# Patient Record
Sex: Male | Born: 1965 | Race: White | Hispanic: No | Smoking: Current some day smoker
Health system: Southern US, Community
[De-identification: ages and names within clinical notes are randomized; demographics above are authoritative.]

## PROBLEM LIST (undated history)

## (undated) DIAGNOSIS — Z87442 Personal history of urinary calculi: Secondary | ICD-10-CM

## (undated) DIAGNOSIS — G5603 Carpal tunnel syndrome, bilateral upper limbs: Secondary | ICD-10-CM

## (undated) DIAGNOSIS — I10 Essential (primary) hypertension: Secondary | ICD-10-CM

## (undated) DIAGNOSIS — S069X9A Unspecified intracranial injury with loss of consciousness of unspecified duration, initial encounter: Secondary | ICD-10-CM

## (undated) DIAGNOSIS — F419 Anxiety disorder, unspecified: Secondary | ICD-10-CM

## (undated) DIAGNOSIS — I219 Acute myocardial infarction, unspecified: Secondary | ICD-10-CM

## (undated) DIAGNOSIS — J45909 Unspecified asthma, uncomplicated: Secondary | ICD-10-CM

## (undated) DIAGNOSIS — W57XXXA Bitten or stung by nonvenomous insect and other nonvenomous arthropods, initial encounter: Secondary | ICD-10-CM

## (undated) DIAGNOSIS — I2510 Atherosclerotic heart disease of native coronary artery without angina pectoris: Secondary | ICD-10-CM

## (undated) HISTORY — DX: Bitten or stung by nonvenomous insect and other nonvenomous arthropods, initial encounter: W57.XXXA

## (undated) HISTORY — DX: Anxiety disorder, unspecified: F41.9

---
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
## 1997-01-28 HISTORY — PX: WISDOM TOOTH EXTRACTION: SHX21

## 1998-12-09 ENCOUNTER — Inpatient Hospital Stay (HOSPITAL_COMMUNITY): Admission: EM | Admit: 1998-12-09 | Discharge: 1998-12-10 | Payer: Self-pay | Admitting: *Deleted

## 1998-12-10 ENCOUNTER — Encounter: Payer: Self-pay | Admitting: Internal Medicine

## 1999-08-22 ENCOUNTER — Emergency Department (HOSPITAL_COMMUNITY): Admission: EM | Admit: 1999-08-22 | Discharge: 1999-08-22 | Payer: Self-pay | Admitting: Emergency Medicine

## 2003-12-30 ENCOUNTER — Ambulatory Visit: Payer: Self-pay | Admitting: Cardiology

## 2004-01-09 ENCOUNTER — Ambulatory Visit: Payer: Self-pay

## 2004-10-10 ENCOUNTER — Emergency Department (HOSPITAL_COMMUNITY): Admission: EM | Admit: 2004-10-10 | Discharge: 2004-10-10 | Payer: Self-pay | Admitting: Emergency Medicine

## 2007-06-13 ENCOUNTER — Emergency Department (HOSPITAL_COMMUNITY): Admission: EM | Admit: 2007-06-13 | Discharge: 2007-06-13 | Payer: Self-pay | Admitting: Emergency Medicine

## 2009-08-27 ENCOUNTER — Emergency Department (HOSPITAL_COMMUNITY): Admission: EM | Admit: 2009-08-27 | Discharge: 2009-08-27 | Payer: Self-pay | Admitting: Emergency Medicine

## 2012-03-15 ENCOUNTER — Encounter (HOSPITAL_COMMUNITY): Payer: Self-pay | Admitting: Emergency Medicine

## 2012-03-15 ENCOUNTER — Emergency Department (HOSPITAL_COMMUNITY)
Admission: EM | Admit: 2012-03-15 | Discharge: 2012-03-15 | Disposition: A | Discharge status: Home / Self Care | Payer: Self-pay | Attending: Emergency Medicine | Admitting: Emergency Medicine

## 2012-03-15 ENCOUNTER — Emergency Department (HOSPITAL_COMMUNITY): Payer: Self-pay

## 2012-03-15 DIAGNOSIS — M2550 Pain in unspecified joint: Secondary | ICD-10-CM | POA: Insufficient documentation

## 2012-03-15 DIAGNOSIS — J45909 Unspecified asthma, uncomplicated: Secondary | ICD-10-CM | POA: Insufficient documentation

## 2012-03-15 DIAGNOSIS — Z8669 Personal history of other diseases of the nervous system and sense organs: Secondary | ICD-10-CM | POA: Insufficient documentation

## 2012-03-15 DIAGNOSIS — W010XXA Fall on same level from slipping, tripping and stumbling without subsequent striking against object, initial encounter: Secondary | ICD-10-CM | POA: Insufficient documentation

## 2012-03-15 DIAGNOSIS — Y9389 Activity, other specified: Secondary | ICD-10-CM | POA: Insufficient documentation

## 2012-03-15 DIAGNOSIS — W19XXXA Unspecified fall, initial encounter: Secondary | ICD-10-CM

## 2012-03-15 DIAGNOSIS — M546 Pain in thoracic spine: Secondary | ICD-10-CM

## 2012-03-15 DIAGNOSIS — Y9289 Other specified places as the place of occurrence of the external cause: Secondary | ICD-10-CM | POA: Insufficient documentation

## 2012-03-15 DIAGNOSIS — Z79899 Other long term (current) drug therapy: Secondary | ICD-10-CM | POA: Insufficient documentation

## 2012-03-15 DIAGNOSIS — F17200 Nicotine dependence, unspecified, uncomplicated: Secondary | ICD-10-CM | POA: Insufficient documentation

## 2012-03-15 HISTORY — DX: Essential (primary) hypertension: I10

## 2012-03-15 HISTORY — DX: Carpal tunnel syndrome, bilateral upper limbs: G56.03

## 2012-03-15 HISTORY — DX: Unspecified asthma, uncomplicated: J45.909

## 2012-03-15 MED ORDER — CYCLOBENZAPRINE HCL 10 MG PO TABS: 10.0000 mg | ORAL_TABLET | Freq: Two times a day (BID) | ORAL | Status: DC | PRN

## 2012-03-15 MED ORDER — HYDROCODONE-ACETAMINOPHEN 5-325 MG PO TABS: 1.0000 | ORAL_TABLET | ORAL | Status: DC | PRN

## 2012-03-15 MED ORDER — NAPROXEN 500 MG PO TABS: 500.0000 mg | ORAL_TABLET | Freq: Two times a day (BID) | ORAL | Status: DC

## 2012-03-15 NOTE — ED Notes (Signed)
Patient transported to X-ray 

## 2012-03-15 NOTE — ED Provider Notes (Signed)
History     CSN: [id number]  Arrival date & time 03/15/12  1208   First MD Initiated Contact with Patient 03/15/12 1248      Chief Complaint  Patient presents with  . Back Pain  . Shoulder Pain    (Consider location/radiation/quality/duration/timing/severity/associated sxs/prior treatment) HPI Adam [name] is a 47 y.o. male who state he slipped on ice 2 days ago, fell down onto his back. States landed onto right shoulder and back. Did not hit head. For the last two days, pain in the thoracic spine area, headache, neck pain. No pain radiating down extremities. No numbness or weakness of arms or legs. No dizziness, visual changes, n/v. Taking ibuprofen with no relief.    Past Medical History  Diagnosis Date  . Hypertension   . Asthma   . Carpal tunnel syndrome on both sides     History reviewed. No pertinent past surgical history.  Family History  Problem Relation Age of Onset  . Hypertension Father   . Cancer Father   . Heart failure Father     History  Substance Use Topics  . Smoking status: Current Every Day Smoker    Types: Cigarettes  . Smokeless tobacco: Not on file  . Alcohol Use: Yes      Review of Systems  Constitutional: Negative for fever and chills.  HENT: Positive for neck pain. Negative for neck stiffness.   Eyes: Negative for visual disturbance.  Respiratory: Negative.   Cardiovascular: Negative.   Gastrointestinal: Negative for nausea, vomiting and abdominal pain.  Genitourinary: Negative for dysuria and flank pain.  Musculoskeletal: Positive for back pain and arthralgias.  Skin: Negative.   Neurological: Positive for headaches. Negative for dizziness, weakness, light-headedness and numbness.  Hematological: Negative.     Allergies  Review of patient's allergies indicates no known allergies.  Home Medications   Current Outpatient Rx  Name  Route  Sig  Dispense  Refill  . albuterol (PROVENTIL HFA;VENTOLIN HFA) 108 (90 BASE) MCG/ACT  inhaler   Inhalation   Inhale 2 puffs into the lungs every 6 (six) hours as needed for wheezing or shortness of breath.         [name] ibuprofen (ADVIL,MOTRIN) 200 MG tablet   Oral   Take 400 mg by mouth every 6 (six) hours as needed for pain.         . Multiple Vitamin (MULTIVITAMIN WITH MINERALS) TABS   Oral   Take 1 tablet by mouth daily.         . pregabalin (LYRICA) 25 MG capsule   Oral   Take 25 mg by mouth 2 (two) times daily as needed (for pain).         . cyclobenzaprine (FLEXERIL) 10 MG tablet   Oral   Take 1 tablet (10 mg total) by mouth 2 (two) times daily as needed for muscle spasms.   20 tablet   0   . HYDROcodone-acetaminophen (NORCO/VICODIN) 5-325 MG per tablet   Oral   Take 1 tablet by mouth every 4 (four) hours as needed for pain.   6 tablet   0   . naproxen (NAPROSYN) 500 MG tablet   Oral   Take 1 tablet (500 mg total) by mouth 2 (two) times daily.   30 tablet   0     BP 139/96  Pulse 95  Temp(Src) 98.3 F (36.8 C) (Oral)  SpO2 100%  Physical Exam  Nursing note and vitals reviewed. Constitutional: He is oriented to person,  place, and time. He appears well-developed and well-nourished. No distress.  HENT:  Head: Normocephalic and atraumatic.  Right Ear: External ear normal.  Left Ear: External ear normal.  Nose: Nose normal.  Mouth/Throat: Oropharynx is clear and moist.  Eyes: Conjunctivae are normal. Pupils are equal, round, and reactive to light.  Neck: Neck supple.  Cardiovascular: Normal rate, regular rhythm and normal heart sounds.   Pulmonary/Chest: Effort normal and breath sounds normal. No respiratory distress. He has no wheezes. He has no rales.  Musculoskeletal:  Tender in cervical and thoracic midline spine. Pain with ROM of the neck. Full ROM. Full ROM of bilateral shoulder joints. Good strength with resistance with shoulder flexion, extension, ab and adduction. 5/5 and normal deltoid, bicep and tricep strength. 5/5 and equal  strength in LE. Gait normal.   Neurological: He is alert and oriented to person, place, and time. No cranial nerve deficit. Coordination normal.  Skin: Skin is warm and dry.  Psychiatric: He has a normal mood and affect. His behavior is normal.    ED Course  Procedures (including critical care time)  Labs Reviewed - No data to display Dg Cervical Spine Complete  03/15/2012  *RADIOLOGY REPORT*  Clinical Data: Back pain, shoulder pain.  CERVICAL SPINE - COMPLETE 4+ VIEW  Comparison: None.  Findings: Degenerative disc disease changes at C4-5 with disc space narrowing and early spurring.  Normal alignment.  Prevertebral soft tissues are normal.  Mild to moderate right neural foraminal narrowing from C4-5 through C6-7.  Mild neural foraminal narrowing on the right at the same levels.  No fracture.  IMPRESSION: Degenerative changes as above.  Bilateral neural foraminal narrowing from C4-5 through C6-7, right greater than left.   Original Report Authenticated By: Charlett Nose, M.D.    Dg Thoracic Spine 2 View  03/15/2012  *RADIOLOGY REPORT*  Clinical Data: Back pain.Fall.  THORACIC SPINE - 2 VIEW  Comparison: None  Findings: Early degenerative spurring in the mid and lower thoracic spine.  Normal alignment.  No fracture.  IMPRESSION: No acute bony abnormality.   Original Report Authenticated By: Charlett Nose, M.D.      1. Fall   2. Pain in thoracic spine       MDM  Pt with mechanical fall. No head injury. No signs of head trauma. Neurovascularly intact. Tender in midline and paravertebral cervical and thoracic spine. X-rays obtained and negative. Will treat with pain medications, muscle relaxant at home, follow up with PCP.        Lottie Mussel, PA 03/15/12 1635

## 2012-03-15 NOTE — ED Notes (Signed)
Pt slipped on ice 2 days ago. C/o headache, upper back pain, r/shoulder pain, neck pain. Tx with OTC meds not effective

## 2012-03-18 NOTE — ED Provider Notes (Signed)
Medical screening examination/treatment/procedure(s) were performed by non-physician practitioner and as supervising physician I was immediately available for consultation/collaboration.  Alexandra Lipps R. Brad Lieurance, MD 03/18/12 1101 

## 2012-05-25 ENCOUNTER — Telehealth: Payer: Self-pay | Admitting: Family Medicine

## 2012-05-25 MED ORDER — DOXYCYCLINE HYCLATE 100 MG PO TABS: 100.0000 mg | ORAL_TABLET | Freq: Two times a day (BID) | ORAL | Status: DC

## 2012-05-25 NOTE — Telephone Encounter (Signed)
Wife wants to know if you could call an antibiotic for tick bite.Pt has red adn swollen area for 2 days and located at the buttocks.

## 2012-05-25 NOTE — Telephone Encounter (Signed)
Pt aware of antibiotic and med c/o

## 2012-05-25 NOTE — Telephone Encounter (Signed)
Doxy 100 mg pobid for 10 days

## 2012-09-14 ENCOUNTER — Emergency Department (HOSPITAL_COMMUNITY): Payer: Medicaid Other

## 2012-09-14 ENCOUNTER — Emergency Department (HOSPITAL_COMMUNITY)
Admission: EM | Admit: 2012-09-14 | Discharge: 2012-09-14 | Disposition: A | Discharge status: Home / Self Care | Payer: Medicaid Other | Attending: Emergency Medicine | Admitting: Emergency Medicine

## 2012-09-14 ENCOUNTER — Encounter (HOSPITAL_COMMUNITY): Payer: Self-pay | Admitting: *Deleted

## 2012-09-14 DIAGNOSIS — Y9389 Activity, other specified: Secondary | ICD-10-CM | POA: Insufficient documentation

## 2012-09-14 DIAGNOSIS — Y929 Unspecified place or not applicable: Secondary | ICD-10-CM | POA: Insufficient documentation

## 2012-09-14 DIAGNOSIS — S61512A Laceration without foreign body of left wrist, initial encounter: Secondary | ICD-10-CM

## 2012-09-14 DIAGNOSIS — J45909 Unspecified asthma, uncomplicated: Secondary | ICD-10-CM | POA: Insufficient documentation

## 2012-09-14 DIAGNOSIS — Z8739 Personal history of other diseases of the musculoskeletal system and connective tissue: Secondary | ICD-10-CM | POA: Insufficient documentation

## 2012-09-14 DIAGNOSIS — Z79899 Other long term (current) drug therapy: Secondary | ICD-10-CM | POA: Insufficient documentation

## 2012-09-14 DIAGNOSIS — I10 Essential (primary) hypertension: Secondary | ICD-10-CM | POA: Insufficient documentation

## 2012-09-14 DIAGNOSIS — W260XXA Contact with knife, initial encounter: Secondary | ICD-10-CM | POA: Insufficient documentation

## 2012-09-14 DIAGNOSIS — F17200 Nicotine dependence, unspecified, uncomplicated: Secondary | ICD-10-CM | POA: Insufficient documentation

## 2012-09-14 DIAGNOSIS — S61509A Unspecified open wound of unspecified wrist, initial encounter: Secondary | ICD-10-CM | POA: Insufficient documentation

## 2012-09-14 MED ORDER — HYDROCODONE-ACETAMINOPHEN 5-325 MG PO TABS: 2.0000 | ORAL_TABLET | Freq: Four times a day (QID) | ORAL | Status: DC | PRN

## 2012-09-14 NOTE — ED Notes (Signed)
Pt cut right mid wrist with utility knife by accident.  Pt has one inch lac, bleeding controlled, and moves all digits

## 2012-09-14 NOTE — ED Notes (Signed)
Wound dressed per PA instructions. Sling applied.

## 2012-09-14 NOTE — ED Notes (Signed)
PT ambulated with baseline gait; VSS; A&Ox3; no signs of distress; respirations even and unlabored; skin warm and dry; no questions upon discharge.  

## 2012-09-14 NOTE — ED Provider Notes (Signed)
CSN: [id number]     Arrival date & time 09/14/12  4098 History     First MD Initiated Contact with Patient 09/14/12 [phone]     Chief Complaint  Patient presents with  . Extremity Laceration   (Consider location/radiation/quality/duration/timing/severity/associated sxs/prior Treatment) HPI Comments: Patient presents emergency department with chief complaint of laceration. Patient states that he was using a utility knife, when it slipped out of his hand, and cut the anterior aspect of his left wrist. He states that there is moderately painful, and is worsened when he moves his fingers and wrists. He has not tried anything to alleviate her symptoms. Bleeding is controlled. His last tetanus shot is greater than 10 years ago.  The history is provided by the patient. No language interpreter was used.    Past Medical History  Diagnosis Date  . Hypertension   . Asthma   . Carpal tunnel syndrome on both sides    History reviewed. No pertinent past surgical history. Family History  Problem Relation Age of Onset  . Hypertension Father   . Cancer Father   . Heart failure Father    History  Substance Use Topics  . Smoking status: Current Every Day Smoker    Types: Cigarettes  . Smokeless tobacco: Not on file  . Alcohol Use: Yes    Review of Systems  All other systems reviewed and are negative.    Allergies  Review of patient's allergies indicates no known allergies.  Home Medications   Current Outpatient Rx  Name  Route  Sig  Dispense  Refill  . albuterol (PROVENTIL HFA;VENTOLIN HFA) 108 (90 BASE) MCG/ACT inhaler   Inhalation   Inhale 2 puffs into the lungs every 6 (six) hours as needed for wheezing or shortness of breath.         . Multiple Vitamin (MULTIVITAMIN WITH MINERALS) TABS   Oral   Take 1 tablet by mouth daily.         . naproxen sodium (ANAPROX) 220 MG tablet   Oral   Take 220 mg by mouth 2 (two) times daily with a meal.          BP 139/100  Pulse 94   Temp(Src) 98 F (36.7 C) (Oral)  Resp 18  SpO2 97% Physical Exam  Nursing note and vitals reviewed. Constitutional: He is oriented to person, place, and time. He appears well-developed and well-nourished.  HENT:  Head: Normocephalic and atraumatic.  Eyes: Conjunctivae and EOM are normal.  Neck: Normal range of motion.  Cardiovascular: Normal rate.   Pulmonary/Chest: Effort normal.  Abdominal: He exhibits no distension.  Musculoskeletal: Normal range of motion.  Index finger flexion 4/5, remaining fingers 5/5, wrist range of motion and strength 5/5  Neurological: He is alert and oriented to person, place, and time.  Skin: Skin is dry.  1 inch laceration to the distal anterior aspect of the left wrist, bleeding is controlled, no obvious foreign bodies  Psychiatric: He has a normal mood and affect. His behavior is normal. Judgment and thought content normal.  LACERATION REPAIR Performed by: Roxy Horseman Authorized by: Roxy Horseman Consent: Verbal consent obtained. Risks and benefits: risks, benefits and alternatives were discussed Consent given by: patient Patient identity confirmed: provided demographic data Prepped and Draped in normal sterile fashion Wound explored  Laceration Location: Left wrist  Laceration Length: 3 cm   No Foreign Bodies seen or palpated  Anesthesia: local infiltration  Local anesthetic: lidocaine 2 % without epinephrine  Anesthetic total: 10  ml  Irrigation method: syringe Amount of cleaning: standard  Skin closure: 4-0 Prolene   Number of sutures: 5   Technique: Simple   Patient tolerance: Patient tolerated the procedure well with no immediate complications.   ED Course   Procedures (including critical care time)  Labs Reviewed - No data to display No results found. 1. Wrist laceration, left, initial encounter     MDM  Patient with left wrist laceration. He has mild decrease strength with index finger flexion, concerning  for partial tendon laceration. Will order plain film to evaluate for foreign body and reexamined.  Patient seen by and discussed with Dr. Rubin Payor. Some concern for partial tendon laceration. Dr. Rubin Payor advises me to consult hand surgery.  Discussed patient with Dr. Mina Marble from hand surgery, who tells me to close the laceration, and to give the patient instructions to followup in his office tomorrow. Patient tolerated the laceration repair well, and with no complications. He does still have mild decreased strength in flexion, there are no sensation deficits.  Roxy Horseman, PA-C 09/14/12 1249

## 2012-09-14 NOTE — ED Notes (Signed)
WAITING FOR HAND SX TO SEE PT.

## 2012-09-14 NOTE — ED Notes (Signed)
PA at bedside.

## 2012-09-15 ENCOUNTER — Emergency Department (HOSPITAL_COMMUNITY)
Admission: EM | Admit: 2012-09-15 | Discharge: 2012-09-15 | Disposition: A | Discharge status: Home / Self Care | Payer: Self-pay

## 2012-09-15 ENCOUNTER — Encounter (HOSPITAL_COMMUNITY): Payer: Self-pay | Admitting: Pharmacy Technician

## 2012-09-15 ENCOUNTER — Other Ambulatory Visit: Payer: Self-pay | Admitting: Orthopedic Surgery

## 2012-09-15 ENCOUNTER — Encounter (HOSPITAL_COMMUNITY): Payer: Self-pay | Admitting: Nurse Practitioner

## 2012-09-15 DIAGNOSIS — Z23 Encounter for immunization: Secondary | ICD-10-CM

## 2012-09-15 MED ORDER — TETANUS-DIPHTH-ACELL PERTUSSIS 5-2.5-18.5 LF-MCG/0.5 IM SUSP
0.5000 mL | Freq: Once | INTRAMUSCULAR | Status: AC
  Administered 2012-09-15: 0.5 mL via INTRAMUSCULAR

## 2012-09-15 MED ORDER — TETANUS-DIPHTH-ACELL PERTUSSIS 5-2.5-18.5 LF-MCG/0.5 IM SUSP
INTRAMUSCULAR | Status: AC
  Filled 2012-09-15: qty 0.5

## 2012-09-15 NOTE — ED Notes (Signed)
Pt was treated here yesterday for laceration, was supposed to get a tetanus shot but didn't. States he went to see specialist today and they wanted him to come back for the tetanus shot. No complaints

## 2012-09-15 NOTE — ED Provider Notes (Signed)
Medical screening examination/treatment/procedure(s) were performed by non-physician practitioner and as supervising physician I was immediately available for consultation/collaboration.  Flint Melter, MD 09/15/12 2226

## 2012-09-15 NOTE — ED Provider Notes (Signed)
Medical screening examination/treatment/procedure(s) were performed by non-physician practitioner and as supervising physician I was immediately available for consultation/collaboration.  Juliet Rude. Rubin Payor, MD 09/15/12 1529

## 2012-09-15 NOTE — ED Provider Notes (Signed)
  CSN: [id number]     Arrival date & time 09/15/12  1306 History     None    Chief Complaint  Patient presents with  . Immunizations   (Consider location/radiation/quality/duration/timing/severity/associated sxs/prior Treatment) HPI Comments: Patient was here yesterday for laceration repair.  Folowed up with hand surgeon today, has surgery scheduled Friday.  No complaints.  Has difficulty moving fingers (suspected tendon laceration), but denies numbness or uncontrolled pain. Did not get tetanus vx yesterday during initial visit.  Presents today only for vaccination.   The history is provided by the patient.    Past Medical History  Diagnosis Date  . Hypertension   . Asthma   . Carpal tunnel syndrome on both sides    History reviewed. No pertinent past surgical history. Family History  Problem Relation Age of Onset  . Hypertension Father   . Cancer Father   . Heart failure Father    History  Substance Use Topics  . Smoking status: Current Every Day Smoker    Types: Cigarettes  . Smokeless tobacco: Not on file  . Alcohol Use: Yes    Review of Systems  Skin: Positive for wound. Negative for color change.  Neurological: Positive for numbness.    Allergies  Review of patient's allergies indicates no known allergies.  Home Medications   Current Outpatient Rx  Name  Route  Sig  Dispense  Refill  . albuterol (PROVENTIL HFA;VENTOLIN HFA) 108 (90 BASE) MCG/ACT inhaler   Inhalation   Inhale 2 puffs into the lungs every 6 (six) hours as needed for wheezing or shortness of breath.         [name] HYDROcodone-acetaminophen (NORCO/VICODIN) 5-325 MG per tablet   Oral   Take 2 tablets by mouth every 6 (six) hours as needed for pain.   15 tablet   0   . Multiple Vitamin (MULTIVITAMIN WITH MINERALS) TABS   Oral   Take 1 tablet by mouth daily.         . naproxen sodium (ANAPROX) 220 MG tablet   Oral   Take 220 mg by mouth 2 (two) times daily with a meal.          BP  133/87  Pulse 86  Temp(Src) 98.5 F (36.9 C) (Oral)  Resp 16  SpO2 98% Physical Exam  Nursing note and vitals reviewed. Constitutional: He appears well-developed and well-nourished. No distress.  HENT:  Head: Normocephalic and atraumatic.  Neck: Neck supple.  Pulmonary/Chest: Effort normal.  Musculoskeletal:  Left wrist in splint.  Left hand with sensation intact, capillary refill < 2 seconds.  Limited flexion of fingers.    Neurological: He is alert.  Skin: He is not diaphoretic.    ED Course   Procedures (including critical care time)  Labs Reviewed - No data to display Dg Wrist Complete Left  09/14/2012   *RADIOLOGY REPORT*  Clinical Data: Laceration left wrist.  LEFT WRIST - COMPLETE 3+ VIEW  Comparison: None.  Findings: Imaged bones, joints and soft tissues appear normal.  No radiopaque foreign body is identified.  IMPRESSION: Negative exam.   Original Report Authenticated By: Holley Dexter, M.D.   1. Tetanus toxoid vaccination administered at current visit     MDM  Left wrist laceration repair yesterday, no tetanus vx given.  Pt was seen by hand surgeon this morning and has scheduled surgery for Friday.  Tetanus given.  No further treatment needed in ED.    Trixie Dredge, PA-C 09/15/12 1733

## 2012-09-17 ENCOUNTER — Encounter (HOSPITAL_COMMUNITY): Payer: Self-pay

## 2012-09-17 ENCOUNTER — Encounter (HOSPITAL_COMMUNITY)
Admission: RE | Admit: 2012-09-17 | Discharge: 2012-09-17 | Disposition: A | Discharge status: Home / Self Care | Payer: Medicaid Other | Source: Ambulatory Visit | Attending: Orthopedic Surgery | Admitting: Orthopedic Surgery

## 2012-09-17 DIAGNOSIS — Z01812 Encounter for preprocedural laboratory examination: Secondary | ICD-10-CM | POA: Insufficient documentation

## 2012-09-17 HISTORY — DX: Personal history of urinary calculi: Z87.442

## 2012-09-17 HISTORY — DX: Unspecified intracranial injury with loss of consciousness of unspecified duration, initial encounter: S06.9X9A

## 2012-09-17 LAB — CBC
HCT: 40.5 % (ref 39.0–52.0)
Hemoglobin: 14.7 g/dL (ref 13.0–17.0)
RDW: 13.6 % (ref 11.5–15.5)
WBC: 11.6 10*3/uL — ABNORMAL HIGH (ref 4.0–10.5)

## 2012-09-17 MED ORDER — CEFAZOLIN SODIUM-DEXTROSE 2-3 GM-% IV SOLR
2.0000 g | INTRAVENOUS | Status: AC
  Administered 2012-09-18: 2 g via INTRAVENOUS
  Filled 2012-09-17: qty 50

## 2012-09-17 NOTE — Pre-Procedure Instructions (Signed)
Adam [name]  09/17/2012   Your procedure is scheduled on: Friday, August 22nd.  Report to [facility] at 5:30 AM.  Call this number if you have problems the morning of surgery: [phone]   Remember:   Do not eat food or drink liquids after midnight.   Take these medicines the morning of surgery with A SIP OF WATER: if needed take: Hydrocodone- Acetaminophen (Vicodin).              Use inhaler and bring to the hospital with you.   Do not wear jewelry, make-up or nail polish.  Do not wear lotions, powders, or perfumes. You may wear deodorant.   Men may shave face and neck.  Do not bring valuables to the hospital.  [facility] is not responsible for any belongings or valuables.  Contacts, dentures or bridgework may not be worn into surgery.  Leave suitcase in the car. After surgery it may be brought to your room.  For patients admitted to the hospital, checkout time is 11:00 AM the day of discharge.   Patients discharged the day of surgery will not be allowed to drivehome.  Name and phone number of your driver: -   Special Instructions:  Shower with CHG wash (Bactoshield) tonight and again in the am prior to arriving to hospital.   Please read over the following fact sheets that you were given: Pain Booklet, Coughing and Deep Breathing and Surgical Site Infection Prevention

## 2012-09-18 ENCOUNTER — Ambulatory Visit (HOSPITAL_COMMUNITY)
Admission: RE | Admit: 2012-09-18 | Discharge: 2012-09-18 | Disposition: A | Discharge status: Home / Self Care | Payer: Medicaid Other | Source: Ambulatory Visit | Attending: Orthopedic Surgery | Admitting: Orthopedic Surgery

## 2012-09-18 ENCOUNTER — Encounter (HOSPITAL_COMMUNITY): Payer: Self-pay | Admitting: Vascular Surgery

## 2012-09-18 ENCOUNTER — Encounter (HOSPITAL_COMMUNITY): Payer: Self-pay | Admitting: *Deleted

## 2012-09-18 ENCOUNTER — Ambulatory Visit (HOSPITAL_COMMUNITY): Payer: Medicaid Other | Admitting: Vascular Surgery

## 2012-09-18 ENCOUNTER — Encounter (HOSPITAL_COMMUNITY)
Admission: RE | Disposition: A | Discharge status: Home / Self Care | Payer: Self-pay | Source: Ambulatory Visit | Attending: Orthopedic Surgery

## 2012-09-18 DIAGNOSIS — S51809A Unspecified open wound of unspecified forearm, initial encounter: Secondary | ICD-10-CM | POA: Insufficient documentation

## 2012-09-18 DIAGNOSIS — J45909 Unspecified asthma, uncomplicated: Secondary | ICD-10-CM | POA: Insufficient documentation

## 2012-09-18 DIAGNOSIS — X58XXXA Exposure to other specified factors, initial encounter: Secondary | ICD-10-CM | POA: Insufficient documentation

## 2012-09-18 DIAGNOSIS — I10 Essential (primary) hypertension: Secondary | ICD-10-CM | POA: Insufficient documentation

## 2012-09-18 DIAGNOSIS — F17200 Nicotine dependence, unspecified, uncomplicated: Secondary | ICD-10-CM | POA: Insufficient documentation

## 2012-09-18 DIAGNOSIS — Y929 Unspecified place or not applicable: Secondary | ICD-10-CM | POA: Insufficient documentation

## 2012-09-18 DIAGNOSIS — G5600 Carpal tunnel syndrome, unspecified upper limb: Secondary | ICD-10-CM | POA: Insufficient documentation

## 2012-09-18 DIAGNOSIS — Z79899 Other long term (current) drug therapy: Secondary | ICD-10-CM | POA: Insufficient documentation

## 2012-09-18 DIAGNOSIS — S51802A Unspecified open wound of left forearm, initial encounter: Secondary | ICD-10-CM

## 2012-09-18 HISTORY — PX: WOUND EXPLORATION: SHX6188

## 2012-09-18 SURGERY — WOUND EXPLORATION
Anesthesia: General | Site: Wrist | Laterality: Left | Wound class: Clean

## 2012-09-18 MED ORDER — OXYCODONE HCL 5 MG PO TABS: 5.0000 mg | ORAL_TABLET | Freq: Once | ORAL | Status: DC | PRN

## 2012-09-18 MED ORDER — FENTANYL CITRATE 0.05 MG/ML IJ SOLN
INTRAMUSCULAR | Status: DC | PRN
  Administered 2012-09-18: 50 ug via INTRAVENOUS
  Administered 2012-09-18: 25 ug via INTRAVENOUS

## 2012-09-18 MED ORDER — MIDAZOLAM HCL 5 MG/5ML IJ SOLN
INTRAMUSCULAR | Status: DC | PRN
  Administered 2012-09-18: 2 mg via INTRAVENOUS

## 2012-09-18 MED ORDER — CHLORHEXIDINE GLUCONATE 4 % EX LIQD: 60.0000 mL | Freq: Once | CUTANEOUS | Status: DC

## 2012-09-18 MED ORDER — MEPERIDINE HCL 25 MG/ML IJ SOLN: 6.2500 mg | INTRAMUSCULAR | Status: DC | PRN

## 2012-09-18 MED ORDER — PROMETHAZINE HCL 25 MG/ML IJ SOLN: 6.2500 mg | INTRAMUSCULAR | Status: DC | PRN

## 2012-09-18 MED ORDER — HYDROMORPHONE HCL PF 1 MG/ML IJ SOLN
INTRAMUSCULAR | Status: AC
  Filled 2012-09-18: qty 1

## 2012-09-18 MED ORDER — OXYCODONE HCL 5 MG/5ML PO SOLN: 5.0000 mg | Freq: Once | ORAL | Status: DC | PRN

## 2012-09-18 MED ORDER — SODIUM CHLORIDE 0.9 % IR SOLN
Status: DC | PRN
  Administered 2012-09-18: 1000 mL

## 2012-09-18 MED ORDER — PROPOFOL 10 MG/ML IV BOLUS
INTRAVENOUS | Status: DC | PRN
  Administered 2012-09-18: 200 mg via INTRAVENOUS

## 2012-09-18 MED ORDER — MIDAZOLAM HCL 2 MG/2ML IJ SOLN: 0.5000 mg | Freq: Once | INTRAMUSCULAR | Status: DC | PRN

## 2012-09-18 MED ORDER — BUPIVACAINE HCL (PF) 0.25 % IJ SOLN
INTRAMUSCULAR | Status: AC
  Filled 2012-09-18: qty 30

## 2012-09-18 MED ORDER — LACTATED RINGERS IV SOLN: INTRAVENOUS | Status: DC

## 2012-09-18 MED ORDER — LIDOCAINE HCL (CARDIAC) 20 MG/ML IV SOLN
INTRAVENOUS | Status: DC | PRN
  Administered 2012-09-18: 60 mg via INTRAVENOUS

## 2012-09-18 MED ORDER — LACTATED RINGERS IV SOLN
INTRAVENOUS | Status: DC | PRN
  Administered 2012-09-18 (×2): via INTRAVENOUS

## 2012-09-18 MED ORDER — ONDANSETRON HCL 4 MG/2ML IJ SOLN
INTRAMUSCULAR | Status: DC | PRN
  Administered 2012-09-18: 4 mg via INTRAVENOUS

## 2012-09-18 MED ORDER — OXYCODONE-ACETAMINOPHEN 5-325 MG PO TABS: 1.0000 | ORAL_TABLET | ORAL | Status: DC | PRN

## 2012-09-18 MED ORDER — BUPIVACAINE HCL 0.25 % IJ SOLN
INTRAMUSCULAR | Status: DC | PRN
  Administered 2012-09-18: 10 mL

## 2012-09-18 MED ORDER — HYDROMORPHONE HCL PF 1 MG/ML IJ SOLN
0.2500 mg | INTRAMUSCULAR | Status: DC | PRN
  Administered 2012-09-18 (×4): 0.5 mg via INTRAVENOUS

## 2012-09-18 SURGICAL SUPPLY — 55 items
BANDAGE ELASTIC 4 VELCRO ST LF (GAUZE/BANDAGES/DRESSINGS) ×2 IMPLANT
BANDAGE GAUZE ELAST BULKY 4 IN (GAUZE/BANDAGES/DRESSINGS) ×2 IMPLANT
BNDG CMPR 9X4 STRL LF SNTH (GAUZE/BANDAGES/DRESSINGS) ×1
BNDG ESMARK 4X9 LF (GAUZE/BANDAGES/DRESSINGS) ×2 IMPLANT
CLOTH BEACON ORANGE TIMEOUT ST (SAFETY) ×2 IMPLANT
CORDS BIPOLAR (ELECTRODE) ×2 IMPLANT
COVER SURGICAL LIGHT HANDLE (MISCELLANEOUS) ×2 IMPLANT
CUFF TOURNIQUET SINGLE 18IN (TOURNIQUET CUFF) ×2 IMPLANT
CUFF TOURNIQUET SINGLE 24IN (TOURNIQUET CUFF) IMPLANT
DECANTER SPIKE VIAL GLASS SM (MISCELLANEOUS) IMPLANT
DRAPE OEC MINIVIEW 54X84 (DRAPES) IMPLANT
DRAPE SURG 17X23 STRL (DRAPES) ×2 IMPLANT
DURAPREP 26ML APPLICATOR (WOUND CARE) ×2 IMPLANT
GAUZE XEROFORM 1X8 LF (GAUZE/BANDAGES/DRESSINGS) ×2 IMPLANT
GLOVE BIO SURGEON STRL SZ8.5 (GLOVE) ×2 IMPLANT
GLOVE BIOGEL PI IND STRL 8 (GLOVE) ×1 IMPLANT
GLOVE BIOGEL PI INDICATOR 8 (GLOVE) ×1
GLOVE BIOGEL PI ORTHO PRO 7.5 (GLOVE) ×1
GLOVE PI ORTHO PRO STRL 7.5 (GLOVE) ×1 IMPLANT
GLOVE SURG SS PI 7.5 STRL IVOR (GLOVE) ×2 IMPLANT
GOWN BRE IMP SLV AUR XL STRL (GOWN DISPOSABLE) ×2 IMPLANT
GOWN PREVENTION PLUS XLARGE (GOWN DISPOSABLE) ×2 IMPLANT
GOWN SRG XL XLNG 56XLVL 4 (GOWN DISPOSABLE) ×1 IMPLANT
GOWN STRL NON-REIN LRG LVL3 (GOWN DISPOSABLE) IMPLANT
GOWN STRL NON-REIN XL XLG LVL4 (GOWN DISPOSABLE) ×2
KIT BASIN OR (CUSTOM PROCEDURE TRAY) ×2 IMPLANT
KIT ROOM TURNOVER OR (KITS) ×2 IMPLANT
LOOP VESSEL MAXI BLUE (MISCELLANEOUS) IMPLANT
MANIFOLD NEPTUNE II (INSTRUMENTS) IMPLANT
NEEDLE HYPO 25GX1X1/2 BEV (NEEDLE) ×2 IMPLANT
NS IRRIG 1000ML POUR BTL (IV SOLUTION) ×2 IMPLANT
PACK ORTHO EXTREMITY (CUSTOM PROCEDURE TRAY) ×2 IMPLANT
PAD ARMBOARD 7.5X6 YLW CONV (MISCELLANEOUS) ×4 IMPLANT
PAD CAST 4YDX4 CTTN HI CHSV (CAST SUPPLIES) ×1 IMPLANT
PADDING CAST COTTON 4X4 STRL (CAST SUPPLIES) ×2
SPEAR EYE SURG WECK-CEL (MISCELLANEOUS) IMPLANT
SPECIMEN JAR SMALL (MISCELLANEOUS) IMPLANT
SPONGE GAUZE 4X4 12PLY (GAUZE/BANDAGES/DRESSINGS) ×2 IMPLANT
STRIP CLOSURE SKIN 1/2X4 (GAUZE/BANDAGES/DRESSINGS) IMPLANT
SUCTION FRAZIER TIP 10 FR DISP (SUCTIONS) IMPLANT
SUT ETHIBOND 3-0 V-5 (SUTURE) IMPLANT
SUT ETHILON 5 0 PS 2 18 (SUTURE) IMPLANT
SUT MERSILENE 4 0 P 3 (SUTURE) IMPLANT
SUT PROLENE 3 0 PS 2 (SUTURE) IMPLANT
SUT PROLENE 6 0 P 1 18 (SUTURE) ×2 IMPLANT
SUT PROLENE 6 0 PC 1 (SUTURE) ×2 IMPLANT
SUT SILK 4 0 PS 2 (SUTURE) IMPLANT
SUT VIC AB 3-0 FS2 27 (SUTURE) IMPLANT
SUT VICRYL RAPIDE 4/0 PS 2 (SUTURE) ×2 IMPLANT
SYR CONTROL 10ML LL (SYRINGE) ×2 IMPLANT
TOWEL OR 17X24 6PK STRL BLUE (TOWEL DISPOSABLE) ×2 IMPLANT
TOWEL OR 17X26 10 PK STRL BLUE (TOWEL DISPOSABLE) ×2 IMPLANT
TUBE CONNECTING 12X1/4 (SUCTIONS) IMPLANT
UNDERPAD 30X30 INCONTINENT (UNDERPADS AND DIAPERS) ×2 IMPLANT
WATER STERILE IRR 1000ML POUR (IV SOLUTION) IMPLANT

## 2012-09-18 NOTE — Anesthesia Postprocedure Evaluation (Signed)
  Anesthesia Post-op Note  Patient: Adam [name]  Procedure(s) Performed: Procedure(s): EXPLORE AND REPAIR AS NEEDED LEFT WRIST (Left)  Patient Location: PACU  Anesthesia Type:General  Level of Consciousness: awake, alert , oriented and patient cooperative  Airway and Oxygen Therapy: Patient Spontanous Breathing  Post-op Pain: none  Post-op Assessment: Post-op Vital signs reviewed, Patient's Cardiovascular Status Stable, Respiratory Function Stable, Patent Airway, No signs of Nausea or vomiting and Pain level controlled  Post-op Vital Signs: Reviewed and stable  Complications: No apparent anesthesia complications

## 2012-09-18 NOTE — Anesthesia Preprocedure Evaluation (Addendum)
Anesthesia Evaluation  Patient identified by MRN, date of birth, ID band Patient awake    Reviewed: Allergy & Precautions, H&P , NPO status , Patient's Chart, lab work & pertinent test results  History of Anesthesia Complications Negative for: history of anesthetic complications  Airway Mallampati: II TM Distance: >3 FB Neck ROM: Full    Dental  (+) Chipped, Missing and Dental Advisory Given   Pulmonary asthma (no recent inhaler use) , Current Smoker,  breath sounds clear to auscultation  Pulmonary exam normal       Cardiovascular negative cardio ROS  Rhythm:Regular Rate:Normal     Neuro/Psych negative neurological ROS     GI/Hepatic negative GI ROS, Neg liver ROS,   Endo/Other  negative endocrine ROS  Renal/GU negative Renal ROS     Musculoskeletal   Abdominal   Peds  Hematology   Anesthesia Other Findings   Reproductive/Obstetrics                          Anesthesia Physical Anesthesia Plan  ASA: II  Anesthesia Plan: General   Post-op Pain Management:    Induction: Intravenous  Airway Management Planned: LMA  Additional Equipment:   Intra-op Plan:   Post-operative Plan:   Informed Consent: I have reviewed the patients History and Physical, chart, labs and discussed the procedure including the risks, benefits and alternatives for the proposed anesthesia with the patient or authorized representative who has indicated his/her understanding and acceptance.   Dental advisory given  Plan Discussed with: CRNA and Surgeon  Anesthesia Plan Comments: (Plan routine monitors, GA- LMA OK)        Anesthesia Quick Evaluation

## 2012-09-18 NOTE — Transfer of Care (Signed)
Immediate Anesthesia Transfer of Care Note  Patient: Adam [name]  Procedure(s) Performed: Procedure(s): EXPLORE AND REPAIR AS NEEDED LEFT WRIST (Left)  Patient Location: PACU  Anesthesia Type:General  Level of Consciousness: awake, alert , oriented and patient cooperative  Airway & Oxygen Therapy: Patient Spontanous Breathing and Patient connected to nasal cannula oxygen  Post-op Assessment: Report given to PACU RN, Post -op Vital signs reviewed and stable and Patient moving all extremities  Post vital signs: Reviewed and stable  Complications: No apparent anesthesia complications

## 2012-09-18 NOTE — Op Note (Signed)
See note [id number]

## 2012-09-18 NOTE — Anesthesia Procedure Notes (Signed)
Procedure Name: LMA Insertion Date/Time: 09/18/2012 7:30 AM Performed by: Jerilee Hoh Pre-anesthesia Checklist: Patient identified, Emergency Drugs available, Suction available and Patient being monitored Patient Re-evaluated:Patient Re-evaluated prior to inductionOxygen Delivery Method: Circle system utilized Preoxygenation: Pre-oxygenation with 100% oxygen Intubation Type: IV induction LMA: LMA inserted LMA Size: 4.0 Number of attempts: 1 Placement Confirmation: positive ETCO2 and breath sounds checked- equal and bilateral Tube secured with: Tape Dental Injury: Teeth and Oropharynx as per pre-operative assessment

## 2012-09-18 NOTE — Preoperative (Signed)
Beta Blockers   Reason not to administer Beta Blockers:Not Applicable 

## 2012-09-18 NOTE — H&P (Signed)
Adam [name] is an 47 y.o. male.   Chief Complaint: left distal forearm volar laceration HPI: as above with weakness in flexion  Past Medical History  Diagnosis Date  . Asthma   . Carpal tunnel syndrome on both sides   . Hypertension     no- was up a couple times whene he went to Dr , told he doesnt have HTN  . Head injury, acute, with loss of consciousness     hit on head with bucket at work- bucket fell.  [name] History of kidney stones     Past Surgical History  Procedure Laterality Date  . Wisdom tooth extraction  1999    Family History  Problem Relation Age of Onset  . Hypertension Father   . Cancer Father   . Heart failure Father    Social History:  reports that he has been smoking Cigarettes.  He has a 25 pack-year smoking history. He does not have any smokeless tobacco history on file. He reports that  drinks alcohol. He reports that he does not use illicit drugs.  Allergies: No Known Allergies  Medications Prior to Admission  Medication Sig Dispense Refill  . HYDROcodone-acetaminophen (NORCO/VICODIN) 5-325 MG per tablet Take 2 tablets by mouth every 6 (six) hours as needed for pain.  15 tablet  0  . Multiple Vitamin (MULTIVITAMIN WITH MINERALS) TABS Take 1 tablet by mouth daily.      [name] albuterol (PROVENTIL HFA;VENTOLIN HFA) 108 (90 BASE) MCG/ACT inhaler Inhale 2 puffs into the lungs every 6 (six) hours as needed for wheezing or shortness of breath.      . naproxen sodium (ANAPROX) 220 MG tablet Take 220 mg by mouth 2 (two) times daily as needed (pain).         Results for orders placed during the hospital encounter of 09/17/12 (from the past 48 hour(s))  CBC     Status: Abnormal   Collection Time    09/17/12  3:29 PM      Result Value Range   WBC 11.6 (*) 4.0 - 10.5 K/uL   RBC 4.79  4.22 - 5.81 MIL/uL   Hemoglobin 14.7  13.0 - 17.0 g/dL   HCT 64.4  03.4 - 74.2 %   MCV 84.6  78.0 - 100.0 fL   MCH 30.7  26.0 - 34.0 pg   MCHC 36.3 (*) 30.0 - 36.0 g/dL   RDW 59.5   63.8 - 75.6 %   Platelets 391  150 - 400 K/uL   No results found.  Review of Systems  All other systems reviewed and are negative.    Blood pressure 141/93, pulse 72, temperature 98.1 F (36.7 C), temperature source Oral, resp. rate 18, SpO2 98.00%. Physical Exam  Constitutional: He is oriented to person, place, and time. He appears well-developed and well-nourished.  HENT:  Head: Normocephalic and atraumatic.  Cardiovascular: Normal rate.   Respiratory: Effort normal.  Musculoskeletal:       Left forearm: He exhibits laceration.  Volar laceration with weakness in flexion  Neurological: He is alert and oriented to person, place, and time.  Skin: Skin is warm.  Psychiatric: He has a normal mood and affect. His behavior is normal. Judgment and thought content normal.     Assessment/Plan As above   Plan explore and repair as needed  Anaise Sterbenz A 09/18/2012, 7:20 AM

## 2012-09-18 NOTE — Op Note (Signed)
NAMEZACKERIAH, Adam [name] NO.:  [id number]  MEDICAL RECORD NO.:  [id number]  LOCATION:  [facility]                         FACILITY:  [facility]  PHYSICIAN:  Artist Pais. Maribelle Hopple, M.D.DATE OF BIRTH:  Sep 26, 1965  DATE OF PROCEDURE:  09/18/2012 DATE OF DISCHARGE:                              OPERATIVE REPORT   PREOPERATIVE DIAGNOSIS:  Deep laceration, volar aspect, left wrist.  POSTOPERATIVE DIAGNOSIS:  Deep laceration, volar aspect, left wrist.  PROCEDURE:  Exploration repair of flexor digitorum superficialis to the long and index fingers, left side.  SURGEON:  Artist Pais. Mina Marble, MD  ASSISTANT:  None.  ANESTHESIA:  General.  COMPLICATIONS:  No complications.  DRAINS:  No drains.  DESCRIPTION OF PROCEDURE:  The patient was taken to the operating suite. After induction of general anesthesia, left upper extremity was prepped and draped in sterile fashion.  An Esmarch was used to exsanguinate the limb.  Tourniquet was inflated to 250 mmHg.  At this point in time, an oblique laceration over the distal forearm area 4 cm proximal to the wrist crease was extended proximally and distally in oblique fashion. Dissection was carried down.  The FCR tendon was identified.  It was intact.  The median nerve and palmar cutaneous branch takeoff point were identified and they were intact.  Dissection was carried down to superficialis tendons.  There were oblique lacerations to the FDS to the index and long.  These were debrided of nonviable clot and repaired with a running lock 6-0 Prolene epitendinous type sutures.  The wound was then thoroughly irrigated.  Hemostasis achieved with bipolar cautery and loosely closed with 4-0 Vicryl Rapide.  Xeroform, 4x4s, fluffs, and compressive dressing and dorsal extension block splint were applied. The patient tolerated the procedure well and went to the recovery room in stable fashion.     Artist Pais Mina Marble, M.D.     MAW/MEDQ  D:   09/18/2012  T:  09/18/2012  Job:  [id number]

## 2012-09-22 ENCOUNTER — Encounter (HOSPITAL_COMMUNITY): Payer: Self-pay | Admitting: Orthopedic Surgery

## 2012-11-20 ENCOUNTER — Ambulatory Visit: Payer: Self-pay | Admitting: Family Medicine

## 2012-11-27 ENCOUNTER — Ambulatory Visit (INDEPENDENT_AMBULATORY_CARE_PROVIDER_SITE_OTHER): Payer: Medicaid Other | Admitting: Family Medicine

## 2012-11-27 ENCOUNTER — Encounter: Payer: Self-pay | Admitting: Family Medicine

## 2012-11-27 VITALS — BP 120/78 | HR 72 | Temp 98.5°F | Resp 16 | Ht 64.0 in | Wt 153.0 lb

## 2012-11-27 DIAGNOSIS — G5601 Carpal tunnel syndrome, right upper limb: Secondary | ICD-10-CM

## 2012-11-27 DIAGNOSIS — G5600 Carpal tunnel syndrome, unspecified upper limb: Secondary | ICD-10-CM

## 2012-11-27 MED ORDER — PREGABALIN 75 MG PO CAPS: 75.0000 mg | ORAL_CAPSULE | Freq: Three times a day (TID) | ORAL | Status: DC

## 2012-11-27 NOTE — Progress Notes (Signed)
  Subjective:    Patient ID: Adam [name], male    DOB: 13-Feb-1965, 47 y.o.   MRN: [id number]  HPI  Patient has a history of carpal tunnel syndrome in both hands.  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 He is constantly using his hands. This causes aching and burning pain in both hands. It is also causing numbness and tingling in his right hand. He denies any weakness or loss of grip strength. He's been wearing the cockup wrist splints on a daily basis without benefit. He is interested in trying Lyrica for nerve pain. Past Medical History  Diagnosis Date  . Asthma   . Carpal tunnel syndrome on both sides   . Hypertension     no- was up a couple times whene he went to Dr , told he doesnt have HTN  . Head injury, acute, with loss of consciousness     hit on head with bucket at work- bucket fell.  [name] History of kidney stones    Past Surgical History  Procedure Laterality Date  . Wisdom tooth extraction  1999  . Wound exploration Left 09/18/2012    Procedure: EXPLORE AND REPAIR AS NEEDED LEFT WRIST;  Surgeon: Marlowe Shores, MD;  Location: [facility];  Service: Orthopedics;  Laterality: Left;   No current outpatient prescriptions on file prior to visit.   No current facility-administered medications on file prior to visit.   No Known Allergies History   Social History  . Marital Status: Single    Spouse Name: N/A    Number of Children: N/A  . Years of Education: N/A   Occupational History  . Not on file.   Social History Main Topics  . Smoking status: Current Every Day Smoker -- 1.00 packs/day for 25 years    Types: Cigarettes  . Smokeless tobacco: Not on file  . Alcohol Use: Yes     Comment: maybe 6 beers a month  . Drug Use: No  . Sexual Activity: Not on file   Other Topics Concern  . Not on file   Social History Narrative  . No narrative on file     Review of Systems  All other systems reviewed and are negative.       Objective:   Physical Exam  Vitals  reviewed. Constitutional: He is oriented to person, place, and time.  Cardiovascular: Normal rate and regular rhythm.   Pulmonary/Chest: Effort normal and breath sounds normal.  Neurological: He is alert and oriented to person, place, and time. He has normal reflexes. He exhibits normal muscle tone. Coordination normal.  Has a positive Tinel sign positive Phalen sign in the right wrist.        Assessment & Plan:  1. Carpal tunnel syndrome, right I am glad to give the patient prescription for Lyrica 75 mg by mouth twice a day for nerve pain. However, I explained to the patient if he starts to develop weakness in his hands the weakness can become permanent. Given his occupation I believe that definitive treatment is necessary. He is failing conservative therapy with wrist splints. Therefore I recommend an orthopedic consultation with Dr. Mina Marble for a cortisone injection in the wrist or possibly surgical release. - Ambulatory referral to Orthopedic Surgery

## 2012-12-03 ENCOUNTER — Telehealth: Payer: Self-pay | Admitting: Family Medicine

## 2012-12-03 MED ORDER — GABAPENTIN 300 MG PO CAPS: 300.0000 mg | ORAL_CAPSULE | Freq: Three times a day (TID) | ORAL | Status: DC

## 2012-12-03 NOTE — Telephone Encounter (Signed)
Insurance will not cover Lyrica. Per Dr. Tanya Nones ok to change to Gabapentin 300 TID. Will send in rx to pharm.

## 2012-12-18 ENCOUNTER — Telehealth: Payer: Self-pay | Admitting: Family Medicine

## 2012-12-18 NOTE — Telephone Encounter (Signed)
Adam [name] is calling from Dr Caryl Bis office following up with a referral, Adam [name] is going to need a nerve conduction before he can see dr Mina Marble however Adam [name]'s significant other states that he had one several years ago. She states he was referred by dr Tanya Nones to somewhere on [address]. Is there anyway that this can  Be found and give Adam [name] a call back her number is [phone]

## 2012-12-21 NOTE — Telephone Encounter (Signed)
The only thing in the chart that I could find was for a referral from 2005 to [facility] for the NCS but it looks like we never received a report. There is nothing in the chart any more recent then that.  Beverly aware.

## 2013-01-26 ENCOUNTER — Other Ambulatory Visit: Payer: Self-pay | Admitting: Orthopedic Surgery

## 2013-01-27 ENCOUNTER — Encounter (HOSPITAL_BASED_OUTPATIENT_CLINIC_OR_DEPARTMENT_OTHER): Payer: Self-pay | Admitting: *Deleted

## 2013-01-27 NOTE — Progress Notes (Signed)
No labs needed

## 2013-02-03 ENCOUNTER — Ambulatory Visit (HOSPITAL_BASED_OUTPATIENT_CLINIC_OR_DEPARTMENT_OTHER)
Admission: RE | Admit: 2013-02-03 | Discharge: 2013-02-03 | Disposition: A | Discharge status: Home / Self Care | Payer: Medicaid Other | Source: Ambulatory Visit | Attending: Orthopedic Surgery | Admitting: Orthopedic Surgery

## 2013-02-03 ENCOUNTER — Other Ambulatory Visit: Payer: Self-pay | Admitting: Family Medicine

## 2013-02-03 ENCOUNTER — Ambulatory Visit (HOSPITAL_BASED_OUTPATIENT_CLINIC_OR_DEPARTMENT_OTHER): Payer: Medicaid Other | Admitting: Certified Registered"

## 2013-02-03 ENCOUNTER — Encounter (HOSPITAL_BASED_OUTPATIENT_CLINIC_OR_DEPARTMENT_OTHER)
Admission: RE | Disposition: A | Discharge status: Home / Self Care | Payer: Self-pay | Source: Ambulatory Visit | Attending: Orthopedic Surgery

## 2013-02-03 ENCOUNTER — Encounter (HOSPITAL_BASED_OUTPATIENT_CLINIC_OR_DEPARTMENT_OTHER): Payer: Medicaid Other | Admitting: Certified Registered"

## 2013-02-03 ENCOUNTER — Encounter (HOSPITAL_BASED_OUTPATIENT_CLINIC_OR_DEPARTMENT_OTHER): Payer: Self-pay | Admitting: Certified Registered"

## 2013-02-03 DIAGNOSIS — M25839 Other specified joint disorders, unspecified wrist: Secondary | ICD-10-CM | POA: Insufficient documentation

## 2013-02-03 DIAGNOSIS — J45909 Unspecified asthma, uncomplicated: Secondary | ICD-10-CM | POA: Insufficient documentation

## 2013-02-03 DIAGNOSIS — G5601 Carpal tunnel syndrome, right upper limb: Secondary | ICD-10-CM

## 2013-02-03 DIAGNOSIS — G5600 Carpal tunnel syndrome, unspecified upper limb: Secondary | ICD-10-CM | POA: Insufficient documentation

## 2013-02-03 DIAGNOSIS — Z5333 Arthroscopic surgical procedure converted to open procedure: Secondary | ICD-10-CM | POA: Insufficient documentation

## 2013-02-03 DIAGNOSIS — F17200 Nicotine dependence, unspecified, uncomplicated: Secondary | ICD-10-CM | POA: Insufficient documentation

## 2013-02-03 HISTORY — PX: CARPAL TUNNEL RELEASE: SHX101

## 2013-02-03 HISTORY — PX: WRIST ARTHROSCOPY: SHX838

## 2013-02-03 HISTORY — PX: ULNA OSTEOTOMY: SHX1077

## 2013-02-03 LAB — POCT HEMOGLOBIN-HEMACUE: Hemoglobin: 18.1 g/dL — ABNORMAL HIGH (ref 13.0–17.0)

## 2013-02-03 SURGERY — CARPAL TUNNEL RELEASE
Anesthesia: General | Site: Wrist | Laterality: Right

## 2013-02-03 MED ORDER — OXYCODONE-ACETAMINOPHEN 5-325 MG PO TABS: 1.0000 | ORAL_TABLET | ORAL | Status: DC | PRN

## 2013-02-03 MED ORDER — MIDAZOLAM HCL 2 MG/2ML IJ SOLN
INTRAMUSCULAR | Status: AC
  Filled 2013-02-03: qty 2

## 2013-02-03 MED ORDER — MIDAZOLAM HCL 5 MG/5ML IJ SOLN
INTRAMUSCULAR | Status: DC | PRN
  Administered 2013-02-03: 2 mg via INTRAVENOUS

## 2013-02-03 MED ORDER — OXYCODONE HCL 5 MG PO TABS: 5.0000 mg | ORAL_TABLET | Freq: Once | ORAL | Status: DC | PRN

## 2013-02-03 MED ORDER — CEFAZOLIN SODIUM 1-5 GM-% IV SOLN
INTRAVENOUS | Status: AC
  Filled 2013-02-03: qty 100

## 2013-02-03 MED ORDER — FENTANYL CITRATE 0.05 MG/ML IJ SOLN
50.0000 ug | INTRAMUSCULAR | Status: DC | PRN
  Administered 2013-02-03: 100 ug via INTRAVENOUS

## 2013-02-03 MED ORDER — MIDAZOLAM HCL 2 MG/2ML IJ SOLN
1.0000 mg | INTRAMUSCULAR | Status: DC | PRN
  Administered 2013-02-03: 2 mg via INTRAVENOUS
  Administered 2013-02-03 (×2): 1 mg via INTRAVENOUS

## 2013-02-03 MED ORDER — ROPIVACAINE HCL 5 MG/ML IJ SOLN
INTRAMUSCULAR | Status: DC | PRN
  Administered 2013-02-03: 40 mL via PERINEURAL

## 2013-02-03 MED ORDER — CEFAZOLIN SODIUM-DEXTROSE 2-3 GM-% IV SOLR
2.0000 g | INTRAVENOUS | Status: AC
  Administered 2013-02-03: 2 g via INTRAVENOUS

## 2013-02-03 MED ORDER — PROPOFOL 10 MG/ML IV BOLUS
INTRAVENOUS | Status: DC | PRN
  Administered 2013-02-03: 200 mg via INTRAVENOUS

## 2013-02-03 MED ORDER — SODIUM CHLORIDE 0.9 % IV SOLN
INTRAVENOUS | Status: DC | PRN
  Administered 2013-02-03: 1000 mL

## 2013-02-03 MED ORDER — FENTANYL CITRATE 0.05 MG/ML IJ SOLN
INTRAMUSCULAR | Status: AC
Start: 2013-02-03 — End: 2013-02-03
  Filled 2013-02-03: qty 2

## 2013-02-03 MED ORDER — DEXAMETHASONE SODIUM PHOSPHATE 10 MG/ML IJ SOLN
INTRAMUSCULAR | Status: DC | PRN
  Administered 2013-02-03: 10 mg via INTRAVENOUS

## 2013-02-03 MED ORDER — ROPIVACAINE HCL 5 MG/ML IJ SOLN: INTRAMUSCULAR | Status: DC | PRN

## 2013-02-03 MED ORDER — LIDOCAINE HCL 1 % IJ SOLN
INTRAMUSCULAR | Status: DC | PRN
  Administered 2013-02-03: 2 mL via INTRADERMAL

## 2013-02-03 MED ORDER — FENTANYL CITRATE 0.05 MG/ML IJ SOLN
INTRAMUSCULAR | Status: AC
  Filled 2013-02-03: qty 6

## 2013-02-03 MED ORDER — LIDOCAINE HCL (CARDIAC) 20 MG/ML IV SOLN
INTRAVENOUS | Status: DC | PRN
  Administered 2013-02-03: 30 mg via INTRAVENOUS

## 2013-02-03 MED ORDER — LACTATED RINGERS IV SOLN
INTRAVENOUS | Status: DC
  Administered 2013-02-03 (×2): via INTRAVENOUS

## 2013-02-03 MED ORDER — ONDANSETRON HCL 4 MG/2ML IJ SOLN
INTRAMUSCULAR | Status: DC | PRN
  Administered 2013-02-03: 4 mg via INTRAVENOUS

## 2013-02-03 MED ORDER — METOCLOPRAMIDE HCL 5 MG/ML IJ SOLN: 10.0000 mg | Freq: Once | INTRAMUSCULAR | Status: DC | PRN

## 2013-02-03 MED ORDER — OXYCODONE HCL 5 MG/5ML PO SOLN: 5.0000 mg | Freq: Once | ORAL | Status: DC | PRN

## 2013-02-03 MED ORDER — CHLORHEXIDINE GLUCONATE 4 % EX LIQD: 60.0000 mL | Freq: Once | CUTANEOUS | Status: DC

## 2013-02-03 MED ORDER — HYDROMORPHONE HCL PF 1 MG/ML IJ SOLN: 0.2500 mg | INTRAMUSCULAR | Status: DC | PRN

## 2013-02-03 SURGICAL SUPPLY — 119 items
APL SKNCLS STERI-STRIP NONHPOA (GAUZE/BANDAGES/DRESSINGS) ×1
BAG DECANTER FOR FLEXI CONT (MISCELLANEOUS) IMPLANT
BANDAGE ELASTIC 3 VELCRO ST LF (GAUZE/BANDAGES/DRESSINGS) IMPLANT
BANDAGE ELASTIC 4 VELCRO ST LF (GAUZE/BANDAGES/DRESSINGS) ×3 IMPLANT
BENZOIN TINCTURE PRP APPL 2/3 (GAUZE/BANDAGES/DRESSINGS) ×3 IMPLANT
BIT DRILL 2.8X5 QR DISP (BIT) ×3 IMPLANT
BIT DRILL QUICK RELEASE 3.5MM (BIT) ×1 IMPLANT
BLADE AVERAGE 25MMX9MM (BLADE) ×1
BLADE AVERAGE 25X9 (BLADE) ×2 IMPLANT
BLADE MINI RND TIP GREEN BEAV (BLADE) IMPLANT
BLADE SURG 15 STRL LF DISP TIS (BLADE) ×2 IMPLANT
BLADE SURG 15 STRL SS (BLADE) ×6
BLADE SURG ROTATE 9660 (MISCELLANEOUS) ×3 IMPLANT
BNDG CMPR 9X4 STRL LF SNTH (GAUZE/BANDAGES/DRESSINGS) ×1
BNDG CMPR MD 5X2 ELC HKLP STRL (GAUZE/BANDAGES/DRESSINGS)
BNDG ELASTIC 2 VLCR STRL LF (GAUZE/BANDAGES/DRESSINGS) IMPLANT
BNDG ESMARK 4X9 LF (GAUZE/BANDAGES/DRESSINGS) ×3 IMPLANT
BNDG GAUZE ELAST 4 BULKY (GAUZE/BANDAGES/DRESSINGS) ×3 IMPLANT
BUR CUDA 2.9 (BURR) IMPLANT
BUR CUDA 2.9MM (BURR)
BUR FULL RADIUS 2.9 (BURR) IMPLANT
BUR FULL RADIUS 2.9MM (BURR)
BUR GATOR 2.9 (BURR) IMPLANT
BUR GATOR 2.9MM (BURR)
BUR SPHERICAL 2.9 (BURR) IMPLANT
BUR SPHERICAL 2.9MM (BURR)
CANISTER SUCT 1200ML W/VALVE (MISCELLANEOUS) IMPLANT
CLOSURE WOUND 1/2 X4 (GAUZE/BANDAGES/DRESSINGS) ×1
CORDS BIPOLAR (ELECTRODE) ×3 IMPLANT
COVER TABLE BACK 60X90 (DRAPES) ×3 IMPLANT
CUFF TOURNIQUET SINGLE 18IN (TOURNIQUET CUFF) ×3 IMPLANT
DECANTER SPIKE VIAL GLASS SM (MISCELLANEOUS) IMPLANT
DRAPE EXTREMITY T 121X128X90 (DRAPE) ×3 IMPLANT
DRAPE INCISE IOBAN 66X45 STRL (DRAPES) IMPLANT
DRAPE LAPAROTOMY T 102X78X121 (DRAPES) IMPLANT
DRAPE OEC MINIVIEW 54X84 (DRAPES) ×3 IMPLANT
DRAPE SURG 17X23 STRL (DRAPES) ×3 IMPLANT
DRILL QUICK RELEASE 3.5MM (BIT) ×3
DURAPREP 26ML APPLICATOR (WOUND CARE) ×3 IMPLANT
ELECT REM PT RETURN 9FT ADLT (ELECTROSURGICAL)
ELECTRODE REM PT RTRN 9FT ADLT (ELECTROSURGICAL) IMPLANT
GAUZE SPONGE 4X4 16PLY XRAY LF (GAUZE/BANDAGES/DRESSINGS) IMPLANT
GAUZE XEROFORM 1X8 LF (GAUZE/BANDAGES/DRESSINGS) IMPLANT
GLOVE BIO SURGEON STRL SZ 6.5 (GLOVE) ×2 IMPLANT
GLOVE BIO SURGEON STRL SZ8 (GLOVE) IMPLANT
GLOVE BIO SURGEONS STRL SZ 6.5 (GLOVE) ×1
GLOVE BIOGEL PI IND STRL 7.0 (GLOVE) ×2 IMPLANT
GLOVE BIOGEL PI IND STRL 8.5 (GLOVE) ×1 IMPLANT
GLOVE BIOGEL PI INDICATOR 7.0 (GLOVE) ×4
GLOVE BIOGEL PI INDICATOR 8.5 (GLOVE) ×2
GLOVE ECLIPSE 6.5 STRL STRAW (GLOVE) ×3 IMPLANT
GLOVE SURG ORTHO 8.0 STRL STRW (GLOVE) ×3 IMPLANT
GLOVE SURG SYN 8.0 (GLOVE) ×3 IMPLANT
GOWN STRL REUS W/ TWL LRG LVL3 (GOWN DISPOSABLE) ×2 IMPLANT
GOWN STRL REUS W/TWL LRG LVL3 (GOWN DISPOSABLE) ×6
GOWN STRL REUS W/TWL XL LVL3 (GOWN DISPOSABLE) ×3 IMPLANT
GUIDEWIRE ORTHO 0.054X6 (WIRE) ×6 IMPLANT
IV NS IRRIG 3000ML ARTHROMATIC (IV SOLUTION) IMPLANT
MASK DUCK BILL (MISCELLANEOUS) ×2
MASK SURG DUCK BILL (MISCELLANEOUS) ×1 IMPLANT
NDL SAFETY ECLIPSE 18X1.5 (NEEDLE) ×2 IMPLANT
NEEDLE HYPO 18GX1.5 SHARP (NEEDLE) ×6
NEEDLE HYPO 22GX1.5 SAFETY (NEEDLE) ×3 IMPLANT
NEEDLE HYPO 25X1 1.5 SAFETY (NEEDLE) IMPLANT
NEEDLE SPNL 25GX3.5 QUINCKE BL (NEEDLE) IMPLANT
NEEDLE TUOHY 20GX3.5 (NEEDLE) IMPLANT
NS IRRIG 1000ML POUR BTL (IV SOLUTION) ×3 IMPLANT
PACK BASIN DAY SURGERY FS (CUSTOM PROCEDURE TRAY) ×3 IMPLANT
PAD CAST 3X4 CTTN HI CHSV (CAST SUPPLIES) ×1 IMPLANT
PAD CAST 4YDX4 CTTN HI CHSV (CAST SUPPLIES) ×1 IMPLANT
PADDING CAST ABS 4INX4YD NS (CAST SUPPLIES) ×2
PADDING CAST ABS COTTON 4X4 ST (CAST SUPPLIES) ×1 IMPLANT
PADDING CAST COTTON 3X4 STRL (CAST SUPPLIES) ×3
PADDING CAST COTTON 4X4 STRL (CAST SUPPLIES) ×3
PADDING UNDERCAST 2  STERILE (CAST SUPPLIES) IMPLANT
PENCIL BUTTON HOLSTER BLD 10FT (ELECTRODE) IMPLANT
PLATE ULNAR SHORTENING (Plate) ×3 IMPLANT
SCREW CORTICAL 3.5X20MM (Screw) ×3 IMPLANT
SCREW HEXALOBE NON-LOCK 3.5X14 (Screw) ×6 IMPLANT
SCREW HEXALOBE NON-LOCK 3.5X16 (Screw) ×6 IMPLANT
SCREW NON LOCKING HEX 3.5X18MM (Screw) ×6 IMPLANT
SET EXT MALE ROTATING LL 32IN (MISCELLANEOUS) ×3 IMPLANT
SET SM JOINT TUBING/CANN (CANNULA) ×3 IMPLANT
SHEET MEDIUM DRAPE 40X70 STRL (DRAPES) ×3 IMPLANT
SLING ARM FOAM STRAP XLG (SOFTGOODS) IMPLANT
SLING ARM LRG ADULT FOAM STRAP (SOFTGOODS) ×3 IMPLANT
SPLINT FAST PLASTER 5X30 (CAST SUPPLIES) ×20
SPLINT PLASTER CAST FAST 5X30 (CAST SUPPLIES) ×10 IMPLANT
SPLINT PLASTER CAST XFAST 3X15 (CAST SUPPLIES) IMPLANT
SPLINT PLASTER CAST XFAST 4X15 (CAST SUPPLIES) IMPLANT
SPLINT PLASTER XTRA FAST SET 4 (CAST SUPPLIES)
SPLINT PLASTER XTRA FASTSET 3X (CAST SUPPLIES)
SPONGE GAUZE 4X4 12PLY (GAUZE/BANDAGES/DRESSINGS) ×3 IMPLANT
STOCKINETTE 4X48 STRL (DRAPES) ×3 IMPLANT
STRIP CLOSURE SKIN 1/2X4 (GAUZE/BANDAGES/DRESSINGS) ×2 IMPLANT
SUCTION FRAZIER TIP 10 FR DISP (SUCTIONS) ×3 IMPLANT
SUT ETHILON 4 0 PS 2 18 (SUTURE) IMPLANT
SUT ETHILON 5 0 PS 2 18 (SUTURE) IMPLANT
SUT MERSILENE 4 0 P 3 (SUTURE) IMPLANT
SUT MON AB 4-0 PC3 18 (SUTURE) IMPLANT
SUT PDS AB 2-0 CT2 27 (SUTURE) IMPLANT
SUT PROLENE 3 0 PS 2 (SUTURE) ×3 IMPLANT
SUT VIC AB 2-0 SH 27 (SUTURE) ×3
SUT VIC AB 2-0 SH 27XBRD (SUTURE) ×1 IMPLANT
SUT VIC AB 4-0 P-3 18XBRD (SUTURE) IMPLANT
SUT VIC AB 4-0 P3 18 (SUTURE)
SUT VICRYL 4-0 PS2 18IN ABS (SUTURE) IMPLANT
SUT VICRYL RAPIDE 4/0 PS 2 (SUTURE) ×6 IMPLANT
SYR BULB 3OZ (MISCELLANEOUS) ×3 IMPLANT
SYRINGE 10CC LL (SYRINGE) ×3 IMPLANT
TOWEL OR 17X24 6PK STRL BLUE (TOWEL DISPOSABLE) ×3 IMPLANT
TRAP DIGIT (INSTRUMENTS) IMPLANT
TRAP FINGER LRG (INSTRUMENTS) ×3 IMPLANT
TUBE CONNECTING 20'X1/4 (TUBING) ×1
TUBE CONNECTING 20X1/4 (TUBING) ×2 IMPLANT
UNDERPAD 30X30 INCONTINENT (UNDERPADS AND DIAPERS) ×3 IMPLANT
WAND 1.5 MICROBLATOR (SURGICAL WAND) IMPLANT
WAND SHORT BEVEL W/CORD (SURGICAL WAND) IMPLANT
WATER STERILE IRR 1000ML POUR (IV SOLUTION) ×3 IMPLANT

## 2013-02-03 NOTE — Discharge Instructions (Signed)
Post Anesthesia Home Care Instructions  Activity: Get plenty of rest for the remainder of the day. A responsible adult should stay with you for 24 hours following the procedure.  For the next 24 hours, DO NOT: -Drive a car -Paediatric nurse -Drink alcoholic beverages -Take any medication unless instructed by your physician -Make any legal decisions or sign important papers.  Meals: Start with liquid foods such as gelatin or soup. Progress to regular foods as tolerated. Avoid greasy, spicy, heavy foods. If nausea and/or vomiting occur, drink only clear liquids until the nausea and/or vomiting subsides. Call your physician if vomiting continues.  Special Instructions/Symptoms: Your throat may feel dry or sore from the anesthesia or the breathing tube placed in your throat during surgery. If this causes discomfort, gargle with warm salt water. The discomfort should disappear within 24 hours.      Regional Anesthesia Blocks  1. Numbness or the inability to move the "blocked" extremity may last from 3-48 hours after placement. The length of time depends on the medication injected and your individual response to the medication. If the numbness is not going away after 48 hours, call your surgeon.  2. The extremity that is blocked will need to be protected until the numbness is gone and the  Strength has returned. Because you cannot feel it, you will need to take extra care to avoid injury. Because it may be weak, you may have difficulty moving it or using it. You may not know what position it is in without looking at it while the block is in effect.  3. For blocks in the legs and feet, returning to weight bearing and walking needs to be done carefully. You will need to wait until the numbness is entirely gone and the strength has returned. You should be able to move your leg and foot normally before you try and bear weight or walk. You will need someone to be with you when you first try to  ensure you do not fall and possibly risk injury.  4. Bruising and tenderness at the needle site are common side effects and will resolve in a few days.  5. Persistent numbness or new problems with movement should be communicated to the surgeon or the [place] [phone] [place] [phone]).            HAND SURGERY    HOME CARE INSTRUCTIONS    The following instructions have been prepared to help you care for yourself upon your return home today.  Wound Care:  Keep your hand elevated above the level of your heart. Do not allow it to dangle by your side. Keep the dressing dry and do not remove it unless your doctor advises you to do so. He will usually change it at the time of you post-op visit. Moving your fingers is advised to stimulate circulation but will depend on the site of your surgery. Of course, if you have a splint applied your doctor will advise you about movement.  Activity:  Do not drive or operate machinery today. Rest today and then you may return to your normal activity and work as indicated by your physician.  Diet: Drink liquids today or eat a light diet. You may resume a regular diet tomorrow.  General expectations: Pain for two or three days. Fingers may become slightly swollen.   Unexpected Observations- Call your doctor if any of these occur: Severe pain not relieved by pain medication. Elevated temperature. Dressing soaked  with blood. Inability to move fingers. White or bluish color to fingers.  Return to office: ***

## 2013-02-03 NOTE — Transfer of Care (Signed)
Immediate Anesthesia Transfer of Care Note  Patient: Adam [name]  Procedure(s) Performed: Procedure(s): RIGHT CARPAL TUNNEL RELEASE (Right) RIGHT ULNAR SHORTENING OSTEOTOMY (Right) RIGHT WRIST ARTHROSCOPY WITH DEBRIDEMENT (Right)  Patient Location: PACU  Anesthesia Type:GA combined with regional for post-op pain  Level of Consciousness: awake, patient cooperative and confused  Airway & Oxygen Therapy: Patient Spontanous Breathing and Patient connected to face mask oxygen  Post-op Assessment: Report given to PACU RN and Post -op Vital signs reviewed and stable  Post vital signs: Reviewed and stable  Complications: No apparent anesthesia complications

## 2013-02-03 NOTE — Anesthesia Procedure Notes (Addendum)
Anesthesia Regional Block:  Axillary brachial plexus block  Pre-Anesthetic Checklist: ,, timeout performed, Correct Patient, Correct Site, Correct Laterality, Correct Procedure, Correct Position, site marked, Risks and benefits discussed,  Surgical consent,  Pre-op evaluation,  At surgeon's request and post-op pain management  Laterality: Right  Prep: chloraprep       Needles:  Injection technique: Single-shot  Needle Type: Other     Needle Length: 9cm  Needle Gauge: 21 and 21 G    Additional Needles:  Procedures: ultrasound guided (picture in chart) and nerve stimulator Axillary brachial plexus block  Nerve Stimulator or Paresthesia:  Response: to 4 nerve groups, 0.4 mA, 4 cm  Additional Responses:   Narrative:  Start time: 02/03/2013 12:40 PM End time: 02/03/2013 12:51 PM Injection made incrementally with aspirations every 5 mL.  Performed by: Personally    Procedure Name: LMA Insertion Date/Time: 02/03/2013 12:57 PM Performed by: Oluwadara Gorman Pre-anesthesia Checklist: Patient identified, Emergency Drugs available, Suction available and Patient being monitored Patient Re-evaluated:Patient Re-evaluated prior to inductionOxygen Delivery Method: Circle System Utilized Preoxygenation: Pre-oxygenation with 100% oxygen Intubation Type: IV induction Ventilation: Mask ventilation without difficulty LMA: LMA inserted LMA Size: 4.0 Number of attempts: 1 Airway Equipment and Method: bite block Placement Confirmation: positive ETCO2 Tube secured with: Tape Dental Injury: Teeth and Oropharynx as per pre-operative assessment

## 2013-02-03 NOTE — Anesthesia Preprocedure Evaluation (Signed)
Anesthesia Evaluation  Patient identified by MRN, date of birth, ID band Patient awake    Reviewed: Allergy & Precautions, H&P , NPO status , Patient's Chart, lab work & pertinent test results, reviewed documented beta blocker date and time   Airway Mallampati: II TM Distance: >3 FB Neck ROM: full    Dental   Pulmonary asthma , Current Smoker,  breath sounds clear to auscultation        Cardiovascular hypertension, Rhythm:regular     Neuro/Psych  Neuromuscular disease negative psych ROS   GI/Hepatic negative GI ROS, Neg liver ROS,   Endo/Other  negative endocrine ROS  Renal/GU negative Renal ROS  negative genitourinary   Musculoskeletal   Abdominal   Peds  Hematology negative hematology ROS (+)   Anesthesia Other Findings See surgeon's H&P   Reproductive/Obstetrics negative OB ROS                           Anesthesia Physical Anesthesia Plan  ASA: II  Anesthesia Plan: General   Post-op Pain Management:    Induction: Intravenous  Airway Management Planned: LMA  Additional Equipment:   Intra-op Plan:   Post-operative Plan:   Informed Consent: I have reviewed the patients History and Physical, chart, labs and discussed the procedure including the risks, benefits and alternatives for the proposed anesthesia with the patient or authorized representative who has indicated his/her understanding and acceptance.   Dental Advisory Given  Plan Discussed with: CRNA and Surgeon  Anesthesia Plan Comments:         Anesthesia Quick Evaluation

## 2013-02-03 NOTE — Op Note (Signed)
See note [phone]

## 2013-02-03 NOTE — Progress Notes (Signed)
[facility] opt Assisted Dr. Albertina Parr with right, ultrasound guided, axillary block. Side rails up, monitors on throughout procedure. See vital signs in flow sheet. Tolerated Procedure well.

## 2013-02-03 NOTE — H&P (Signed)
Adam [name] is an 48 y.o. male.   Chief Complaint: right wrist ulnar pain and median numbness HPI: as above with chronic right wrist ulnar pain with positive variance and positive NCV for CTS  Past Medical History  Diagnosis Date  . Asthma   . Carpal tunnel syndrome on both sides   . Hypertension     no- was up a couple times whene he went to Dr , told he doesnt have HTN  . Head injury, acute, with loss of consciousness     hit on head with bucket at work- bucket fell.  [name] History of kidney stones     Past Surgical History  Procedure Laterality Date  . Wisdom tooth extraction  1999  . Wound exploration Left 09/18/2012    Procedure: EXPLORE AND REPAIR AS NEEDED LEFT WRIST;  Surgeon: Adam Amor, MD;  Location: [facility];  Service: Orthopedics;  Laterality: Left;    Family History  Problem Relation Age of Onset  . Hypertension Father   . Cancer Father   . Heart failure Father    Social History:  reports that he has been smoking Cigarettes.  He has a 25 pack-year smoking history. He does not have any smokeless tobacco history on file. He reports that he drinks alcohol. He reports that he does not use illicit drugs.  Allergies: No Known Allergies  No prescriptions prior to admission    No results found for this or any previous visit (from the past 48 hour(s)). No results found.  Review of Systems  All other systems reviewed and are negative.    Height 5\' 4"  (1.626 m), weight 69.4 kg (153 lb). Physical Exam  Constitutional: He is oriented to person, place, and time. He appears well-developed and well-nourished.  HENT:  Head: Normocephalic and atraumatic.  Cardiovascular: Normal rate and normal heart sounds.   Respiratory: Effort normal.  Musculoskeletal:       Right wrist: He exhibits bony tenderness and swelling.  Chronic right wrist pain ulnarly and positive provacative signs of median nerve compression  Neurological: He is alert and oriented to person, place,  and time.  Skin: Skin is warm.  Psychiatric: He has a normal mood and affect. His behavior is normal. Judgment and thought content normal.     Assessment/Plan As above   Plan right CTR and wrist A- scope with debridement as needed and open ulnar shortening  Adam [name] A 02/03/2013, 7:08 AM

## 2013-02-03 NOTE — Anesthesia Postprocedure Evaluation (Signed)
Anesthesia Post Note  Patient: Adam [name]  Procedure(s) Performed: Procedure(s) (LRB): RIGHT CARPAL TUNNEL RELEASE (Right) RIGHT ULNAR SHORTENING OSTEOTOMY (Right) RIGHT WRIST ARTHROSCOPY WITH DEBRIDEMENT (Right)  Anesthesia type: General  Patient location: PACU  Post pain: Pain level controlled  Post assessment: Patient's Cardiovascular Status Stable  Last Vitals:  Filed Vitals:   02/03/13 1515  BP: 138/94  Pulse: 87  Temp:   Resp: 13    Post vital signs: Reviewed and stable  Level of consciousness: alert  Complications: No apparent anesthesia complications

## 2013-02-04 ENCOUNTER — Ambulatory Visit: Payer: Medicaid Other | Admitting: Occupational Therapy

## 2013-02-04 ENCOUNTER — Encounter (HOSPITAL_BASED_OUTPATIENT_CLINIC_OR_DEPARTMENT_OTHER): Payer: Self-pay | Admitting: Orthopedic Surgery

## 2013-02-05 NOTE — Op Note (Signed)
Adam [name], Adam [name] NO.:  [id number]  MEDICAL RECORD NO.:  [id number]  LOCATION:                               FACILITY:  [facility]  PHYSICIAN:  Sheral Apley. Gay Moncivais, M.D.DATE OF BIRTH:  1965/08/11  DATE OF PROCEDURE:  02/03/2013 DATE OF DISCHARGE:  02/03/2013                              OPERATIVE REPORT   PREOPERATIVE DIAGNOSES:  Chronic right carpal tunnel syndrome, right wrist ulnocarpal abutment syndrome.  POSTOPERATIVE DIAGNOSES:  Chronic right carpal tunnel syndrome, right wrist ulnocarpal abutment syndrome.  PROCEDURE:  Right carpal tunnel release, right wrist arthroscopy, debridement of triangular fibrocartilage complex and lunotriquetral tears, and right open ulnar shortening osteotomy.  SURGEON:  Sheral Apley. Burney Gauze, MD  ASSISTANT:  Daryll Brod, MD  ANESTHESIA:  Axillary block and general.  COMPLICATIONS:  No complications.  DRAINS:  No drains.  DESCRIPTION OF PROCEDURE:  The patient was taken to the operating suite. After induction of adequate general anesthesia, the right upper extremity was prepped and draped in sterile fashion.  An Esmarch was used to exsanguinate the limb.  Tourniquet was inflated to 250 mmHg.  At this point in time, right upper extremity was padded and placed in a wrist traction tower,15 pounds of countertraction across radiocarpal joint.  Standard 3-4 arthroscopic portal was established 1 cm distal to [employer].  The skin was incised sharply.  A blunt dissection was carried down into the joint.  Visualization revealed intact __________ ligaments and intact __________ ligament, TFCC central degenerative tear as well as fraying of the LT ligament.  A 4-5 portal was made under direct vision as well as a 6U outflow portal.  We used a 2.0 suction shaver and debrided the TFCC and LT ligaments down to stable rims. After this was done, final visualization revealed no other ostial cartilaginous lesions.  The instruments and  the scope removed from the portals, they were closed with 3-0 Prolene.  Hand was fully supinated and then a 2-cm incision was made in the palmar aspect of the right hand and along the long metacarpal starting at Kaplan's cardinal line.  Skin was incised.  Palmar fascia was identified and split.  Distal edge of the transverse carpal was identified and split with a 15 blade.  The median nerve was identified and protected with a Soil scientist. Remaining aspects of transverse carpal ligament __________ using curved blunt scissors.  Canal was inspected.  There were no osseous lesions or ganglions present.  It was irrigated and loosely closed with 3-0 Prolene subcuticular stitch.  The elbow was then bent to 90 degrees.  An incision was made along the ulnar border distally and was centered starting at the tip of the ulna distally in approximately 6-8 cm.  The interval between the ECU and FCU was incised.  We did a subperiosteal dissection of the distal ulna on the volar side.  Using an Acumed ulnar shortening plate, an osteotomy was performed using the appropriate cutting jig and a 5-mm shortening osteotomy was performed.  A lag screw placement was obtained as well as proximal and distal cortical fixation, all as per Acumed protocol and technique.  Intraoperative fluoroscopy then revealed slight ulnar neutral variance as compared  to significant ulnar positive variance preoperatively.  All screw lengths were good and the wound was irrigated and loosely closed in layers of 2-0 undyed Vicryl, and 4-0 Vicryl Rapide on the skin.  Xeroform, 4x4s, fluffs, and a sugar-tong splint was applied.  The patient tolerated all the procedures well and went to recovery room in stable fashion.     Sheral Apley Burney Gauze, M.D.     MAW/MEDQ  D:  02/03/2013  T:  02/04/2013  Job:  [id number]

## 2013-02-08 ENCOUNTER — Ambulatory Visit: Payer: Medicaid Other | Attending: Orthopedic Surgery | Admitting: Occupational Therapy

## 2013-02-08 DIAGNOSIS — M25539 Pain in unspecified wrist: Secondary | ICD-10-CM | POA: Insufficient documentation

## 2013-02-08 DIAGNOSIS — Z4789 Encounter for other orthopedic aftercare: Secondary | ICD-10-CM | POA: Insufficient documentation

## 2013-02-09 ENCOUNTER — Ambulatory Visit: Payer: Medicaid Other | Admitting: Occupational Therapy

## 2013-02-17 ENCOUNTER — Encounter: Payer: Medicaid Other | Admitting: Occupational Therapy

## 2013-02-19 ENCOUNTER — Ambulatory Visit: Payer: Medicaid Other | Admitting: Occupational Therapy

## 2013-05-24 ENCOUNTER — Encounter: Payer: Self-pay | Admitting: Family Medicine

## 2013-05-24 ENCOUNTER — Ambulatory Visit (INDEPENDENT_AMBULATORY_CARE_PROVIDER_SITE_OTHER): Payer: Medicaid Other | Admitting: Family Medicine

## 2013-05-24 VITALS — BP 136/96 | HR 94 | Temp 98.1°F | Resp 16 | Ht 64.0 in | Wt 151.0 lb

## 2013-05-24 DIAGNOSIS — G8929 Other chronic pain: Secondary | ICD-10-CM

## 2013-05-24 DIAGNOSIS — Z7251 High risk heterosexual behavior: Secondary | ICD-10-CM

## 2013-05-24 DIAGNOSIS — R1031 Right lower quadrant pain: Secondary | ICD-10-CM

## 2013-05-24 LAB — COMPLETE METABOLIC PANEL WITH GFR
ALT: 27 U/L (ref 0–53)
AST: 22 U/L (ref 0–37)
Albumin: 4.7 g/dL (ref 3.5–5.2)
Alkaline Phosphatase: 98 U/L (ref 39–117)
BUN: 8 mg/dL (ref 6–23)
CO2: 24 mEq/L (ref 19–32)
CREATININE: 1.01 mg/dL (ref 0.50–1.35)
Calcium: 9.8 mg/dL (ref 8.4–10.5)
Chloride: 102 mEq/L (ref 96–112)
GFR, EST NON AFRICAN AMERICAN: 89 mL/min
GFR, Est African American: >89 mL/min
Glucose, Bld: 112 mg/dL — ABNORMAL HIGH (ref 70–99)
Potassium: 5.1 mEq/L (ref 3.5–5.3)
Sodium: 137 mEq/L (ref 135–145)
Total Bilirubin: 0.4 mg/dL (ref 0.2–1.2)
Total Protein: 7.6 g/dL (ref 6.0–8.3)

## 2013-05-24 LAB — RPR

## 2013-05-24 LAB — CBC WITH DIFFERENTIAL/PLATELET
Basophils Absolute: 0.1 10*3/uL (ref 0.0–0.1)
Basophils Relative: 1 % (ref 0–1)
Eosinophils Absolute: 0.2 10*3/uL (ref 0.0–0.7)
Eosinophils Relative: 2 % (ref 0–5)
HCT: 46.6 % (ref 39.0–52.0)
HEMOGLOBIN: 16 g/dL (ref 13.0–17.0)
Lymphocytes Relative: 30 % (ref 12–46)
Lymphs Abs: 2.8 10*3/uL (ref 0.7–4.0)
MCH: 29.6 pg (ref 26.0–34.0)
MCHC: 34.3 g/dL (ref 30.0–36.0)
MCV: 86.1 fL (ref 78.0–100.0)
MONOS PCT: 11 % (ref 3–12)
Monocytes Absolute: 1 10*3/uL (ref 0.1–1.0)
NEUTROS ABS: 5.2 10*3/uL (ref 1.7–7.7)
Neutrophils Relative %: 56 % (ref 43–77)
Platelets: 403 10*3/uL — ABNORMAL HIGH (ref 150–400)
RBC: 5.41 MIL/uL (ref 4.22–5.81)
RDW: 14.6 % (ref 11.5–15.5)
WBC: 9.2 10*3/uL (ref 4.0–10.5)

## 2013-05-24 LAB — SEDIMENTATION RATE: SED RATE: 1 mm/h (ref 0–16)

## 2013-05-24 LAB — HIV ANTIBODY (ROUTINE TESTING W REFLEX): HIV: NONREACTIVE

## 2013-05-24 NOTE — Progress Notes (Signed)
Subjective:    Patient ID: Adam [name], male    DOB: [birth year], 48 y.o.   MRN: [id number]  HPI   Patient is a 48 year old white male who is here today presenting with right lower quadrant abdominal pain has been there for several months. It is primarily in his right inguinal canal and in his right lower back. There is no exacerbating or alleviating factors. He does occasionally have some mild discomfort in his testicle which seems to be positional.  The patient denies any fevers or weight loss. He denies any melena or hematochezia. He denies any dysuria or hematuria. He denies any penile discharge.  However the patient is fixated on the fact that his girlfriend has cheated on him. He has no proof of this but he is concerned he may have a section transmitted infection. He freely admits there are problems in the relationship and therefore he would like to be evaluated. The other symptom he has besides his right inguinal discomfort is occasional indigestion with certain foods. Past Medical History  Diagnosis Date  . Asthma   . Carpal tunnel syndrome on both sides   . Hypertension     no- was up a couple times whene he went to Dr , told he doesnt have HTN  . Head injury, acute, with loss of consciousness     hit on head with bucket at work- bucket fell.  [name] History of kidney stones    Current Outpatient Prescriptions on File Prior to Visit  Medication Sig Dispense Refill  . oxyCODONE-acetaminophen (ROXICET) 5-325 MG per tablet Take 1 tablet by mouth every 4 (four) hours as needed for severe pain.  30 tablet  0  . gabapentin (NEURONTIN) 300 MG capsule TAKE ONE CAPSULE BY MOUTH 3 TIMES A DAY**DISCONTINUE LYRICA**  90 capsule  1   No current facility-administered medications on file prior to visit.   No Known Allergies History   Social History  . Marital Status: Single    Spouse Name: N/A    Number of Children: N/A  . Years of Education: N/A   Occupational History  . Not on file.     Social History Main Topics  . Smoking status: Current Every Day Smoker -- 1.00 packs/day for 25 years    Types: Cigarettes  . Smokeless tobacco: Not on file  . Alcohol Use: Yes     Comment: maybe 6 beers a month  . Drug Use: No  . Sexual Activity: Not on file   Other Topics Concern  . Not on file   Social History Narrative  . No narrative on file   Family History  Problem Relation Age of Onset  . Hypertension Father   . Cancer Father   . Heart failure Father     Review of Systems  All other systems reviewed and are negative.      Objective:   Physical Exam  Vitals reviewed. Constitutional: He is oriented to person, place, and time. He appears well-developed and well-nourished. No distress.  HENT:  Head: Normocephalic and atraumatic.  Right Ear: External ear normal.  Left Ear: External ear normal.  Nose: Nose normal.  Mouth/Throat: Oropharynx is clear and moist. No oropharyngeal exudate.  Eyes: Conjunctivae are normal. Pupils are equal, round, and reactive to light.  Neck: Neck supple. No thyromegaly present.  Cardiovascular: Normal rate, regular rhythm and normal heart sounds.  Exam reveals no gallop and no friction rub.   No murmur heard. Pulmonary/Chest: Effort normal and breath  sounds normal. No respiratory distress. He has no wheezes. He has no rales. He exhibits no tenderness.  Abdominal: Soft. Bowel sounds are normal. He exhibits no distension and no mass. There is no tenderness. There is no rebound and no guarding. Hernia confirmed negative in the right inguinal area and confirmed negative in the left inguinal area.  Genitourinary: Testes normal and penis normal. Right testis shows no mass, no swelling and no tenderness. Right testis is descended. Cremasteric reflex is not absent on the right side. Left testis shows no mass, no swelling and no tenderness. Left testis is descended. Cremasteric reflex is not absent on the left side. Circumcised. No phimosis,  paraphimosis, hypospadias, penile erythema or penile tenderness. No discharge found.  Musculoskeletal: Normal range of motion. He exhibits no edema.  Lymphadenopathy:    He has no cervical adenopathy.       Right: No inguinal adenopathy present.       Left: No inguinal adenopathy present.  Neurological: He is alert and oriented to person, place, and time. He has normal reflexes. No cranial nerve deficit. Coordination normal.  Skin: Skin is warm. No rash noted. He is not diaphoretic. No erythema. No pallor.          Assessment & Plan:  1. Abdominal pain, chronic, right lower quadrant Patient's exam is completely normal. I tried to reassure the patient I did not feel that he has any type of septic transmitted infection. However to assuage his concerns I will check for an HIV test, GC chlamydia probe, and an RPR test. That being said I do not feel that even if he has these infections that they're causing these symptoms.  The vague nature of the symptoms makes him hard to diagnose. It is likely musculoskeletal. The patient is concerned after the lab work returns, we could consult a urologist. Obviously if there abnormalities on the lab work I would pursue it further. - COMPLETE METABOLIC PANEL WITH GFR - CBC with Differential - Sedimentation rate  2. High risk sexual behavior - RPR - GC/chlamydia probe amp, urine - HIV antibody

## 2013-05-25 LAB — GC/CHLAMYDIA PROBE AMP, URINE
Chlamydia, Swab/Urine, PCR: NEGATIVE
GC Probe Amp, Urine: NEGATIVE

## 2014-08-14 IMAGING — CR DG WRIST COMPLETE 3+V*L*
4 series · 4 of 4 positions shown · non-contrast
Comparison: None.

CLINICAL DATA: Laceration left wrist.

LEFT WRIST - COMPLETE 3+ VIEW

[x wrist pa left]
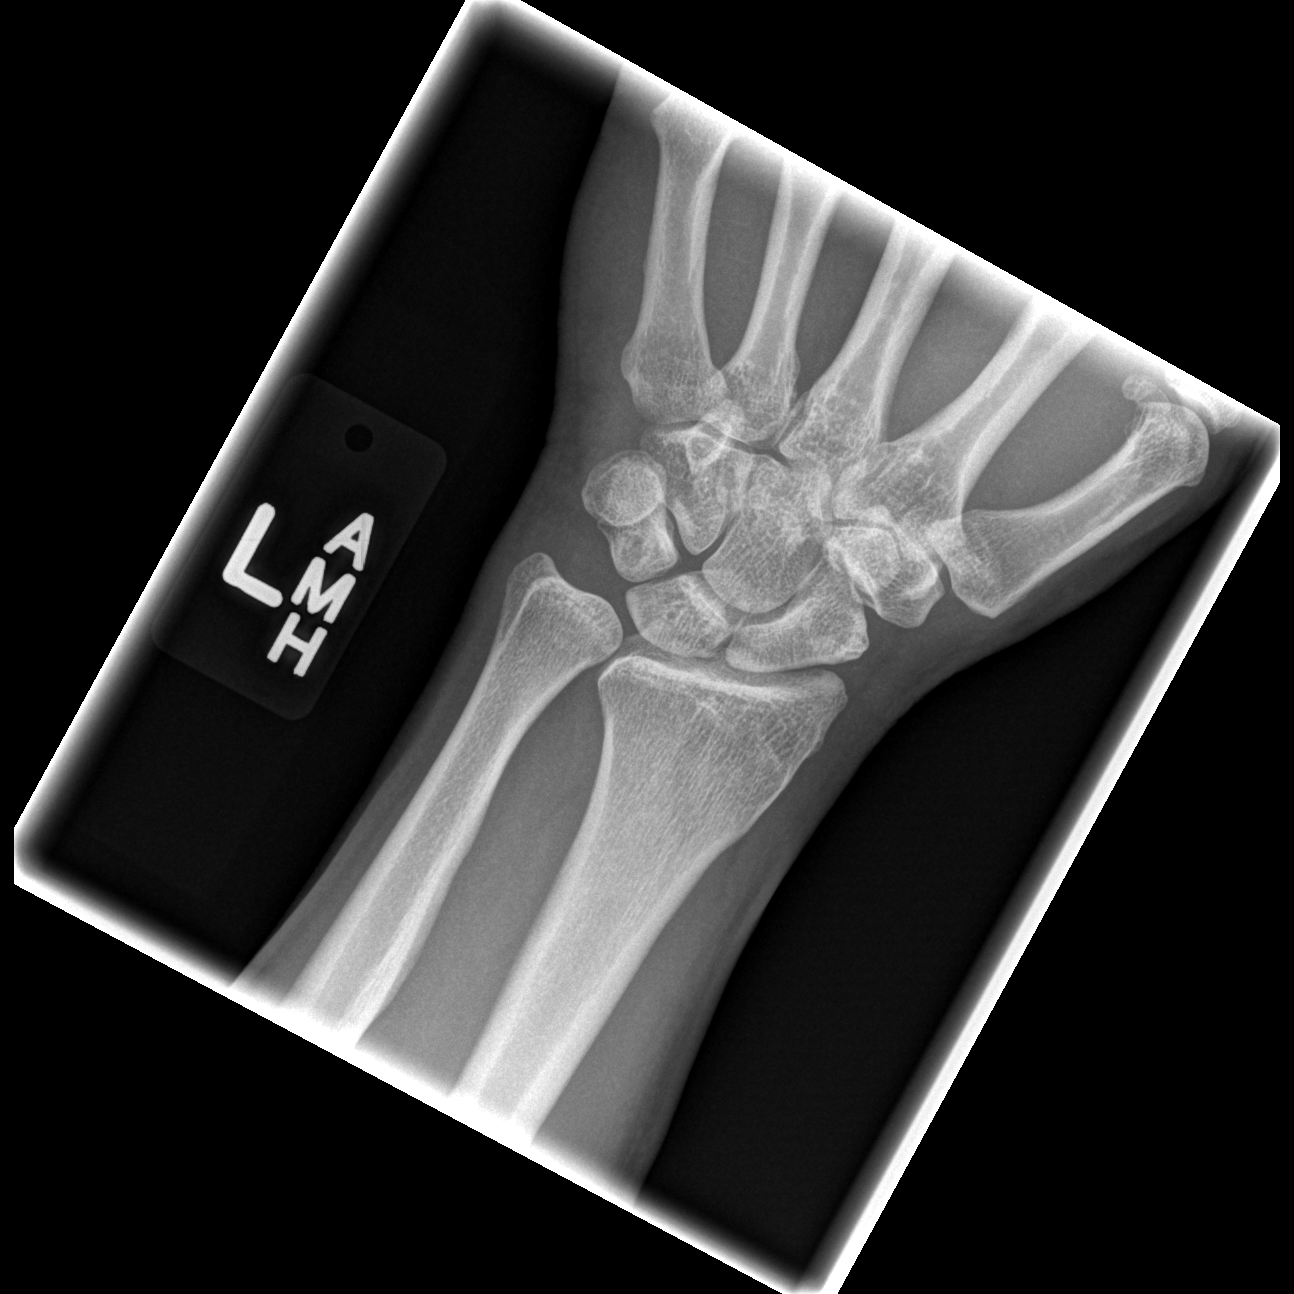

[x wrist obl left]
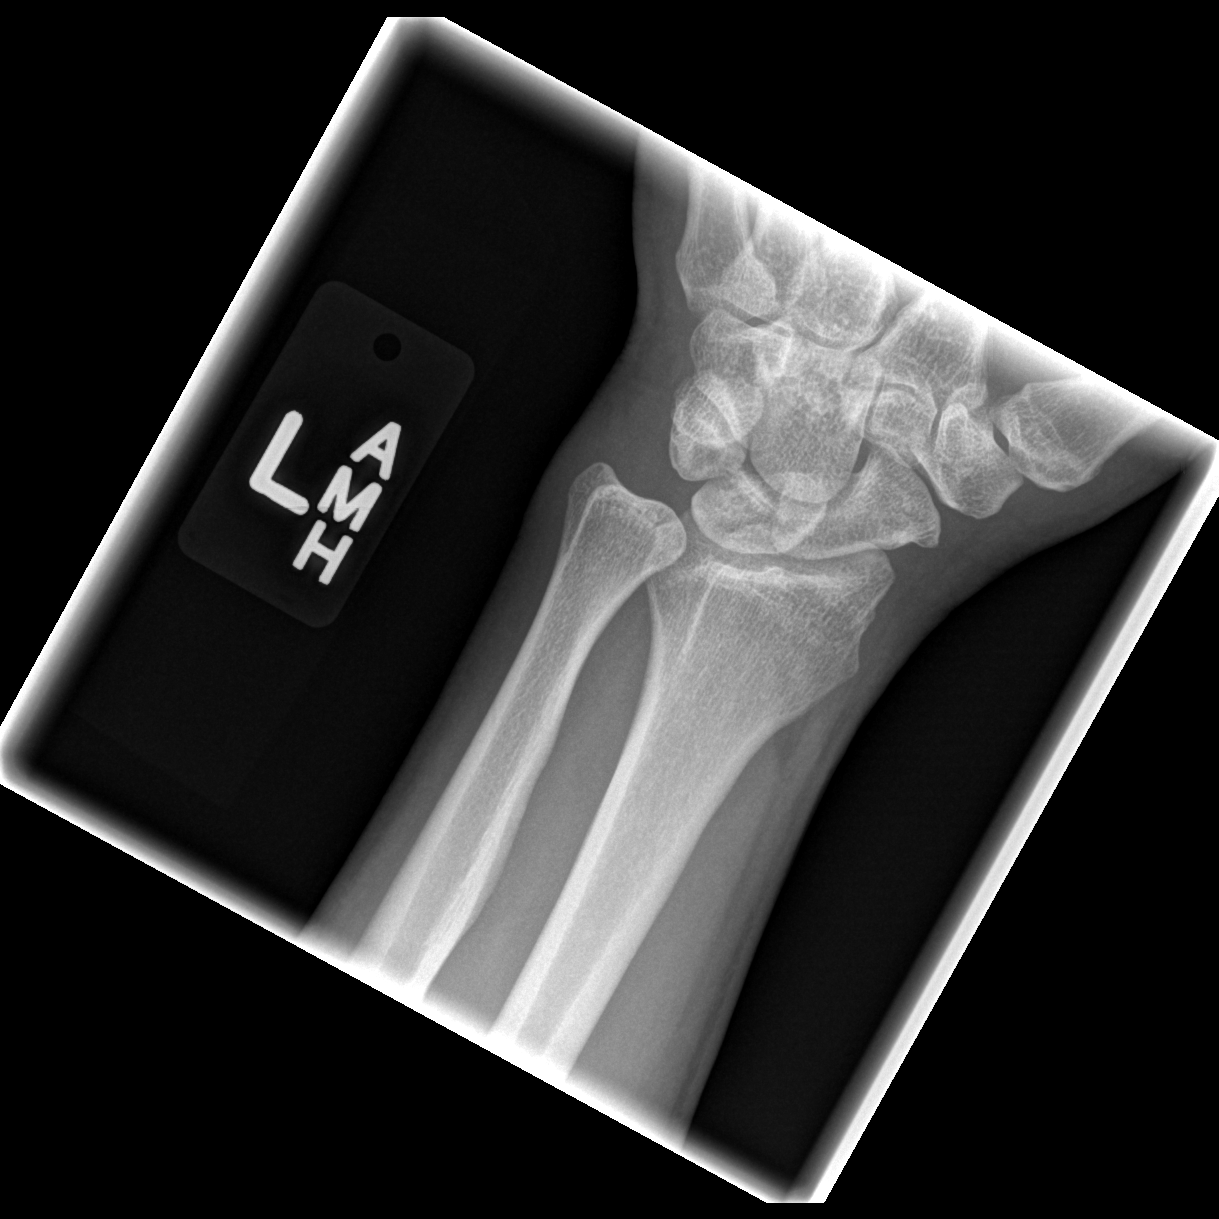

[x wrist lat left]
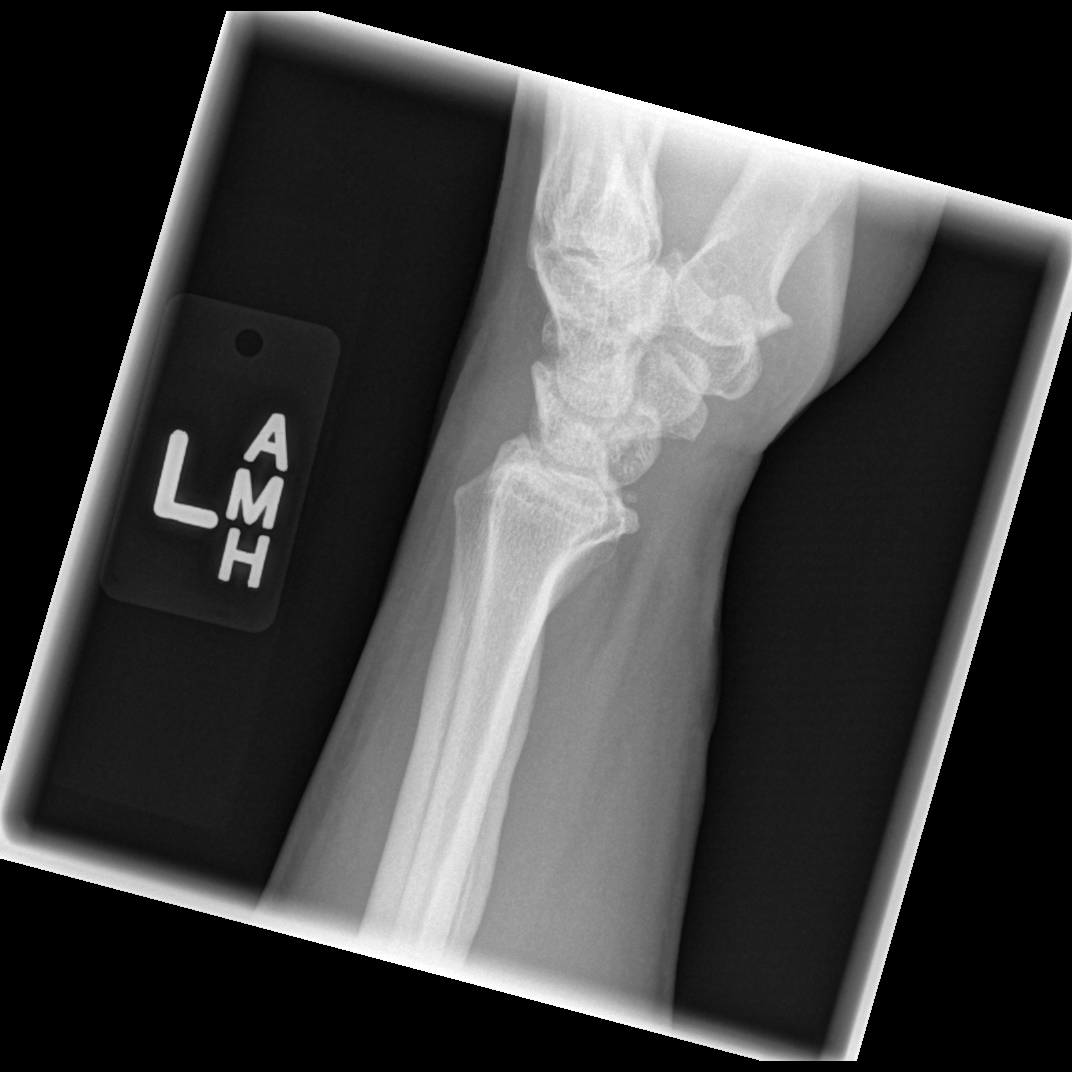

[x navicular]
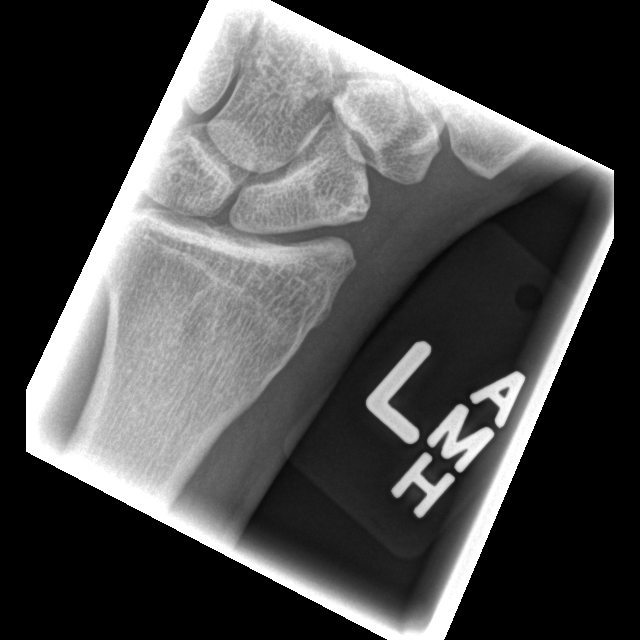

[4 of 4 positions shown; findings below may reference images not displayed]

FINDINGS: Imaged bones, joints and soft tissues appear normal.  No
radiopaque foreign body is identified.
IMPRESSION: Negative exam.

## 2017-04-30 ENCOUNTER — Ambulatory Visit (HOSPITAL_COMMUNITY)
Admission: EM | Admit: 2017-04-30 | Discharge: 2017-04-30 | Disposition: A | Discharge status: Home / Self Care | Payer: Self-pay | Attending: Family Medicine | Admitting: Family Medicine

## 2017-04-30 ENCOUNTER — Encounter (HOSPITAL_COMMUNITY): Payer: Self-pay | Admitting: Emergency Medicine

## 2017-04-30 DIAGNOSIS — L255 Unspecified contact dermatitis due to plants, except food: Secondary | ICD-10-CM

## 2017-04-30 MED ORDER — PREDNISONE 10 MG (48) PO TBPK: ORAL_TABLET | ORAL | 0 refills | Status: DC

## 2017-04-30 NOTE — ED Triage Notes (Signed)
Pt states he burned some stuff that had poison ivy in it, it is on his face, mouth, arms.

## 2017-05-01 NOTE — ED Provider Notes (Signed)
[place]   [id number] 04/30/17 Arrival Time: 2542  ASSESSMENT & PLAN:  1. Rhus dermatitis    Meds ordered this encounter  Medications  . predniSONE (STERAPRED UNI-PAK 48 TAB) 10 MG (48) TBPK tablet    Sig: Take as directed.    Dispense:  48 tablet    Refill:  0   Will follow up with PCP or here if worsening or failing to improve as anticipated. Reviewed expectations re: course of current medical issues. Questions answered. Outlined signs and symptoms indicating need for more acute intervention. Patient verbalized understanding. After Visit Summary given.   SUBJECTIVE:  Adam [name] is a 52 y.o. male who presents with a skin complaint.   Location: "basically all over"; mostly on extremities Onset: abrupt Duration: 1 day Pruritic? Yes Painful? No Progression: increasing steadily  Drainage? 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 New soaps/lotions/topicals/detergents? No Contacts with similar? No Recent travel? No  Other associated symptoms: none Therapies tried thus far: Benadryl without much help. Denies fever. No specific aggravating or alleviating factors reported.  ROS: As per HPI.  OBJECTIVE: Vitals:   04/30/17 1851  BP: (!) 144/86  Pulse: 90  Resp: 18  Temp: 98.7 F (37.1 C)  SpO2: 97%    General appearance: alert; no distress Lungs: clear to auscultation bilaterally Heart: regular rate and rhythm Extremities: no edema Skin: warm and dry; rhus dermatitis mainly on extremities; some on neck and face; no sign of infection Psychological: alert and cooperative; normal mood and affect  No Known Allergies  Past Medical History:  Diagnosis Date  . Asthma   . Carpal tunnel syndrome on both sides   . Head injury, acute, with loss of consciousness ([place])    hit on head with bucket at work- bucket fell.  [name] History of kidney stones   . Hypertension    no- was up a couple times whene he went to Dr , told he doesnt have  HTN   Social History   Socioeconomic History  . Marital status: Single    Spouse name: Not on file  . Number of children: Not on file  . Years of education: Not on file  . Highest education level: Not on file  Occupational History  . Not on file  Social Needs  . Financial resource strain: Not on file  . Food insecurity:    Worry: Not on file    Inability: Not on file  . Transportation needs:    Medical: Not on file    Non-medical: Not on file  Tobacco Use  . Smoking status: Current Every Day Smoker    Packs/day: 1.00    Years: 25.00    Pack years: 25.00    Types: Cigarettes  Substance and Sexual Activity  . Alcohol use: Yes    Comment: maybe 6 beers a month  . Drug use: No  . Sexual activity: Not on file  Lifestyle  . Physical activity:    Days per week: Not on file    Minutes per session: Not on file  . Stress: Not on file  Relationships  . Social connections:    Talks on phone: Not on file    Gets together: Not on file    Attends religious service: Not on file    Active member of club or organization: Not on file    Attends meetings of clubs or organizations: Not on file    Relationship status: Not on file  .  Intimate partner violence:    Fear of current or ex partner: Not on file    Emotionally abused: Not on file    Physically abused: Not on file    Forced sexual activity: Not on file  Other Topics Concern  . Not on file  Social History Narrative  . Not on file   Family History  Problem Relation Age of Onset  . Hypertension Father   . Cancer Father   . Heart failure Father    Past Surgical History:  Procedure Laterality Date  . CARPAL TUNNEL RELEASE Right 02/03/2013   Procedure: RIGHT CARPAL TUNNEL RELEASE;  Surgeon: Schuyler Amor, MD;  Location: [facility];  Service: Orthopedics;  Laterality: Right;  . ULNA OSTEOTOMY Right 02/03/2013   Procedure: RIGHT ULNAR SHORTENING OSTEOTOMY;  Surgeon: Schuyler Amor, MD;  Location: [facility];  Service: Orthopedics;  Laterality: Right;  . WISDOM TOOTH EXTRACTION  1999  . WOUND EXPLORATION Left 09/18/2012   Procedure: EXPLORE AND REPAIR AS NEEDED LEFT WRIST;  Surgeon: Schuyler Amor, MD;  Location: [facility];  Service: Orthopedics;  Laterality: Left;  . WRIST ARTHROSCOPY Right 02/03/2013   Procedure: RIGHT WRIST ARTHROSCOPY WITH DEBRIDEMENT;  Surgeon: Schuyler Amor, MD;  Location: [facility];  Service: Orthopedics;  Laterality: Right;     Vanessa Kick, MD 05/05/17 [phone]

## 2017-10-06 ENCOUNTER — Inpatient Hospital Stay (HOSPITAL_COMMUNITY): Payer: Self-pay

## 2017-10-06 ENCOUNTER — Encounter (HOSPITAL_COMMUNITY): Payer: Self-pay | Admitting: Emergency Medicine

## 2017-10-06 ENCOUNTER — Inpatient Hospital Stay (HOSPITAL_COMMUNITY)
Admission: EM | Admit: 2017-10-06 | Discharge: 2017-10-08 | DRG: 247 | Disposition: A | Discharge status: Home / Self Care | Payer: Self-pay | Attending: Cardiology | Admitting: Cardiology

## 2017-10-06 ENCOUNTER — Inpatient Hospital Stay (HOSPITAL_COMMUNITY)
Admission: EM | Disposition: A | Discharge status: Home / Self Care | Payer: Self-pay | Source: Home / Self Care | Attending: Cardiology

## 2017-10-06 ENCOUNTER — Other Ambulatory Visit: Payer: Self-pay

## 2017-10-06 DIAGNOSIS — E782 Mixed hyperlipidemia: Secondary | ICD-10-CM

## 2017-10-06 DIAGNOSIS — I472 Ventricular tachycardia: Secondary | ICD-10-CM | POA: Diagnosis not present

## 2017-10-06 DIAGNOSIS — Z7952 Long term (current) use of systemic steroids: Secondary | ICD-10-CM

## 2017-10-06 DIAGNOSIS — I959 Hypotension, unspecified: Secondary | ICD-10-CM | POA: Diagnosis present

## 2017-10-06 DIAGNOSIS — Z8249 Family history of ischemic heart disease and other diseases of the circulatory system: Secondary | ICD-10-CM

## 2017-10-06 DIAGNOSIS — I2111 ST elevation (STEMI) myocardial infarction involving right coronary artery: Principal | ICD-10-CM | POA: Diagnosis present

## 2017-10-06 DIAGNOSIS — I4949 Other premature depolarization: Secondary | ICD-10-CM

## 2017-10-06 DIAGNOSIS — Z716 Tobacco abuse counseling: Secondary | ICD-10-CM

## 2017-10-06 DIAGNOSIS — I492 Junctional premature depolarization: Secondary | ICD-10-CM

## 2017-10-06 DIAGNOSIS — E785 Hyperlipidemia, unspecified: Secondary | ICD-10-CM | POA: Diagnosis present

## 2017-10-06 DIAGNOSIS — D72829 Elevated white blood cell count, unspecified: Secondary | ICD-10-CM | POA: Diagnosis present

## 2017-10-06 DIAGNOSIS — Z87442 Personal history of urinary calculi: Secondary | ICD-10-CM

## 2017-10-06 DIAGNOSIS — F17210 Nicotine dependence, cigarettes, uncomplicated: Secondary | ICD-10-CM | POA: Diagnosis present

## 2017-10-06 DIAGNOSIS — I2510 Atherosclerotic heart disease of native coronary artery without angina pectoris: Secondary | ICD-10-CM | POA: Diagnosis present

## 2017-10-06 DIAGNOSIS — Z720 Tobacco use: Secondary | ICD-10-CM | POA: Diagnosis present

## 2017-10-06 DIAGNOSIS — I213 ST elevation (STEMI) myocardial infarction of unspecified site: Secondary | ICD-10-CM | POA: Diagnosis present

## 2017-10-06 DIAGNOSIS — I10 Essential (primary) hypertension: Secondary | ICD-10-CM | POA: Diagnosis present

## 2017-10-06 DIAGNOSIS — Z955 Presence of coronary angioplasty implant and graft: Secondary | ICD-10-CM

## 2017-10-06 HISTORY — PX: TEMPORARY PACEMAKER: CATH118268

## 2017-10-06 HISTORY — DX: Acute myocardial infarction, unspecified: I21.9

## 2017-10-06 HISTORY — PX: CORONARY/GRAFT ACUTE MI REVASCULARIZATION: CATH118305

## 2017-10-06 HISTORY — PX: LEFT HEART CATH AND CORONARY ANGIOGRAPHY: CATH118249

## 2017-10-06 LAB — CBC
HEMATOCRIT: 43 % (ref 39.0–52.0)
HEMATOCRIT: 41.1 % (ref 39.0–52.0)
Hemoglobin: 13.7 g/dL (ref 13.0–17.0)
Hemoglobin: 13.9 g/dL (ref 13.0–17.0)
MCH: 29.3 pg (ref 26.0–34.0)
MCH: 29.8 pg (ref 26.0–34.0)
MCHC: 31.9 g/dL (ref 30.0–36.0)
MCHC: 33.8 g/dL (ref 30.0–36.0)
MCV: 91.9 fL (ref 78.0–100.0)
MCV: 88.2 fL (ref 78.0–100.0)
PLATELETS: 317 10*3/uL (ref 150–400)
Platelets: 312 10*3/uL (ref 150–400)
RBC: 4.68 MIL/uL (ref 4.22–5.81)
RBC: 4.66 MIL/uL (ref 4.22–5.81)
RDW: 14.6 % (ref 11.5–15.5)
RDW: 14.3 % (ref 11.5–15.5)
WBC: 25.7 10*3/uL — ABNORMAL HIGH (ref 4.0–10.5)
WBC: 20.7 10*3/uL — ABNORMAL HIGH (ref 4.0–10.5)

## 2017-10-06 LAB — PROTIME-INR
INR: 1.08
PROTHROMBIN TIME: 13.9 s (ref 11.4–15.2)

## 2017-10-06 LAB — BASIC METABOLIC PANEL
Anion gap: 11 (ref 5–15)
BUN: 13 mg/dL (ref 6–20)
CO2: 23 mmol/L (ref 22–32)
CREATININE: 1.07 mg/dL (ref 0.61–1.24)
Calcium: 8.8 mg/dL — ABNORMAL LOW (ref 8.9–10.3)
Chloride: 104 mmol/L (ref 98–111)
GFR calc Af Amer: >60 mL/min (ref >60)
GLUCOSE: 102 mg/dL — AB (ref 70–99)
POTASSIUM: 4 mmol/L (ref 3.5–5.1)
SODIUM: 138 mmol/L (ref 135–145)

## 2017-10-06 LAB — GLUCOSE, CAPILLARY
Glucose-Capillary: 108 mg/dL — ABNORMAL HIGH (ref 70–99)
Glucose-Capillary: 112 mg/dL — ABNORMAL HIGH (ref 70–99)
Glucose-Capillary: 118 mg/dL — ABNORMAL HIGH (ref 70–99)

## 2017-10-06 LAB — COMPREHENSIVE METABOLIC PANEL
ALT: 37 U/L (ref 0–44)
ANION GAP: 18 — AB (ref 5–15)
AST: 44 U/L — ABNORMAL HIGH (ref 15–41)
Albumin: 3.3 g/dL — ABNORMAL LOW (ref 3.5–5.0)
Alkaline Phosphatase: 73 U/L (ref 38–126)
BUN: 14 mg/dL (ref 6–20)
CO2: 20 mmol/L — ABNORMAL LOW (ref 22–32)
Calcium: 8.4 mg/dL — ABNORMAL LOW (ref 8.9–10.3)
Chloride: 106 mmol/L (ref 98–111)
Creatinine, Ser: 1.38 mg/dL — ABNORMAL HIGH (ref 0.61–1.24)
GFR calc non Af Amer: 58 mL/min — ABNORMAL LOW (ref >60)
Glucose, Bld: 145 mg/dL — ABNORMAL HIGH (ref 70–99)
Potassium: 4.2 mmol/L (ref 3.5–5.1)
SODIUM: 144 mmol/L (ref 135–145)
Total Bilirubin: 0.7 mg/dL (ref 0.3–1.2)
Total Protein: 6 g/dL — ABNORMAL LOW (ref 6.5–8.1)

## 2017-10-06 LAB — POCT I-STAT, CHEM 8
BUN: 16 mg/dL (ref 6–20)
CHLORIDE: 89 mmol/L — AB (ref 98–111)
CREATININE: 1.1 mg/dL (ref 0.61–1.24)
Calcium, Ion: 1.17 mmol/L (ref 1.15–1.40)
Glucose, Bld: 130 mg/dL — ABNORMAL HIGH (ref 70–99)
HCT: 41 % (ref 39.0–52.0)
Hemoglobin: 13.9 g/dL (ref 13.0–17.0)
Potassium: 3.8 mmol/L (ref 3.5–5.1)
Sodium: 126 mmol/L — ABNORMAL LOW (ref 135–145)
TCO2: 19 mmol/L — ABNORMAL LOW (ref 22–32)

## 2017-10-06 LAB — URINALYSIS, COMPLETE (UACMP) WITH MICROSCOPIC
BILIRUBIN URINE: NEGATIVE
GLUCOSE, UA: NEGATIVE mg/dL
HGB URINE DIPSTICK: NEGATIVE
Ketones, ur: NEGATIVE mg/dL
Leukocytes, UA: NEGATIVE
NITRITE: NEGATIVE
Protein, ur: NEGATIVE mg/dL
Specific Gravity, Urine: 1.042 — ABNORMAL HIGH (ref 1.005–1.030)
pH: 5 (ref 5.0–8.0)

## 2017-10-06 LAB — POCT ACTIVATED CLOTTING TIME
Activated Clotting Time: 521 seconds
Activated Clotting Time: 147 seconds

## 2017-10-06 LAB — LIPID PANEL
Cholesterol: 218 mg/dL — ABNORMAL HIGH (ref 0–200)
HDL: 47 mg/dL (ref >40)
LDL Cholesterol: 125 mg/dL — ABNORMAL HIGH (ref 0–99)
TRIGLYCERIDES: 231 mg/dL — AB (ref <150)
Total CHOL/HDL Ratio: 4.6 RATIO
VLDL: 46 mg/dL — AB (ref 0–40)

## 2017-10-06 LAB — MRSA PCR SCREENING: MRSA BY PCR: NEGATIVE

## 2017-10-06 LAB — TROPONIN I
TROPONIN I: 56.45 ng/mL — AB (ref <0.03)
Troponin I: <0.03 ng/mL (ref <0.03)

## 2017-10-06 LAB — ECHOCARDIOGRAM COMPLETE
Height: 65 in
Weight: 2395.0774 oz

## 2017-10-06 LAB — APTT: aPTT: 35 seconds (ref 24–36)

## 2017-10-06 SURGERY — CORONARY/GRAFT ACUTE MI REVASCULARIZATION
Anesthesia: LOCAL

## 2017-10-06 MED ORDER — METOPROLOL TARTRATE 12.5 MG HALF TABLET
12.5000 mg | ORAL_TABLET | Freq: Two times a day (BID) | ORAL | Status: DC
  Administered 2017-10-06 – 2017-10-08 (×5): 12.5 mg via ORAL
  Filled 2017-10-06 (×5): qty 1

## 2017-10-06 MED ORDER — SODIUM CHLORIDE 0.9 % IV SOLN
INTRAVENOUS | Status: AC | PRN
  Administered 2017-10-06: 69 mL/h via INTRAVENOUS

## 2017-10-06 MED ORDER — ATROPINE SULFATE 1 MG/10ML IJ SOSY
PREFILLED_SYRINGE | INTRAMUSCULAR | Status: AC
  Filled 2017-10-06: qty 10

## 2017-10-06 MED ORDER — ONDANSETRON HCL 4 MG/2ML IJ SOLN
4.0000 mg | Freq: Four times a day (QID) | INTRAMUSCULAR | Status: DC | PRN
  Filled 2017-10-06: qty 2

## 2017-10-06 MED ORDER — SODIUM CHLORIDE 0.9 % IV SOLN
INTRAVENOUS | Status: AC | PRN
  Administered 2017-10-06: 1.75 mg/kg/h via INTRAVENOUS

## 2017-10-06 MED ORDER — HEPARIN SODIUM (PORCINE) 1000 UNIT/ML IJ SOLN
INTRAMUSCULAR | Status: AC
  Filled 2017-10-06: qty 1

## 2017-10-06 MED ORDER — ASPIRIN 81 MG PO CHEW
81.0000 mg | CHEWABLE_TABLET | Freq: Every day | ORAL | Status: DC
  Administered 2017-10-07 – 2017-10-08 (×2): 81 mg via ORAL
  Filled 2017-10-06 (×2): qty 1

## 2017-10-06 MED ORDER — MIDAZOLAM HCL 2 MG/2ML IJ SOLN
INTRAMUSCULAR | Status: DC | PRN
  Administered 2017-10-06 (×2): 1 mg via INTRAVENOUS

## 2017-10-06 MED ORDER — LABETALOL HCL 5 MG/ML IV SOLN: 10.0000 mg | INTRAVENOUS | Status: AC | PRN

## 2017-10-06 MED ORDER — SODIUM CHLORIDE 0.9 % IV SOLN
INTRAVENOUS | Status: DC
  Administered 2017-10-06 – 2017-10-07 (×5): via INTRAVENOUS

## 2017-10-06 MED ORDER — TICAGRELOR 90 MG PO TABS
90.0000 mg | ORAL_TABLET | Freq: Two times a day (BID) | ORAL | Status: DC
  Administered 2017-10-06 – 2017-10-08 (×4): 90 mg via ORAL
  Filled 2017-10-06 (×4): qty 1

## 2017-10-06 MED ORDER — HEPARIN (PORCINE) IN NACL 1000-0.9 UT/500ML-% IV SOLN
INTRAVENOUS | Status: DC | PRN
  Administered 2017-10-06 (×3): 500 mL

## 2017-10-06 MED ORDER — BIVALIRUDIN TRIFLUOROACETATE 250 MG IV SOLR
INTRAVENOUS | Status: AC
  Filled 2017-10-06: qty 250

## 2017-10-06 MED ORDER — SODIUM CHLORIDE 0.9% FLUSH: 3.0000 mL | INTRAVENOUS | Status: DC | PRN

## 2017-10-06 MED ORDER — DIPHENHYDRAMINE HCL 50 MG/ML IJ SOLN
25.0000 mg | Freq: Once | INTRAMUSCULAR | Status: AC
  Administered 2017-10-06: 25 mg via INTRAVENOUS

## 2017-10-06 MED ORDER — HYDRALAZINE HCL 20 MG/ML IJ SOLN: 5.0000 mg | INTRAMUSCULAR | Status: AC | PRN

## 2017-10-06 MED ORDER — SODIUM CHLORIDE 0.9 % IV SOLN
INTRAVENOUS | Status: AC | PRN
  Administered 2017-10-06: 10 mL/h via INTRAVENOUS

## 2017-10-06 MED ORDER — NITROGLYCERIN 1 MG/10 ML FOR IR/CATH LAB
INTRA_ARTERIAL | Status: DC | PRN
  Administered 2017-10-06: 100 ug via INTRACORONARY

## 2017-10-06 MED ORDER — TICAGRELOR 90 MG PO TABS
ORAL_TABLET | ORAL | Status: DC | PRN
  Administered 2017-10-06: 180 mg via ORAL

## 2017-10-06 MED ORDER — TICAGRELOR 90 MG PO TABS
ORAL_TABLET | ORAL | Status: AC
  Filled 2017-10-06: qty 2

## 2017-10-06 MED ORDER — BIVALIRUDIN BOLUS VIA INFUSION - CUPID
INTRAVENOUS | Status: DC | PRN
  Administered 2017-10-06: 51.375 mg via INTRAVENOUS

## 2017-10-06 MED ORDER — LIDOCAINE HCL (PF) 1 % IJ SOLN
INTRAMUSCULAR | Status: AC
  Filled 2017-10-06: qty 30

## 2017-10-06 MED ORDER — IOHEXOL 350 MG/ML SOLN
INTRAVENOUS | Status: DC | PRN
  Administered 2017-10-06: 140 mL via INTRAVENOUS

## 2017-10-06 MED ORDER — HEPARIN SODIUM (PORCINE) 5000 UNIT/ML IJ SOLN
5000.0000 [IU] | Freq: Three times a day (TID) | INTRAMUSCULAR | Status: DC
  Administered 2017-10-06 – 2017-10-08 (×4): 5000 [IU] via SUBCUTANEOUS
  Filled 2017-10-06 (×4): qty 1

## 2017-10-06 MED ORDER — LISINOPRIL 2.5 MG PO TABS
2.5000 mg | ORAL_TABLET | Freq: Every day | ORAL | Status: DC
  Administered 2017-10-06 – 2017-10-07 (×2): 2.5 mg via ORAL
  Filled 2017-10-06 (×2): qty 1

## 2017-10-06 MED ORDER — ROSUVASTATIN CALCIUM 20 MG PO TABS
20.0000 mg | ORAL_TABLET | Freq: Every day | ORAL | Status: DC
  Administered 2017-10-06 – 2017-10-07 (×2): 20 mg via ORAL
  Filled 2017-10-06 (×2): qty 1

## 2017-10-06 MED ORDER — INSULIN ASPART 100 UNIT/ML ~~LOC~~ SOLN: 0.0000 [IU] | Freq: Three times a day (TID) | SUBCUTANEOUS | Status: DC

## 2017-10-06 MED ORDER — VERAPAMIL HCL 2.5 MG/ML IV SOLN
INTRAVENOUS | Status: AC
  Filled 2017-10-06: qty 2

## 2017-10-06 MED ORDER — SODIUM CHLORIDE 0.9 % IV SOLN
INTRAVENOUS | Status: AC
  Administered 2017-10-06: 04:00:00 via INTRAVENOUS

## 2017-10-06 MED ORDER — NITROGLYCERIN 1 MG/10 ML FOR IR/CATH LAB
INTRA_ARTERIAL | Status: AC
  Filled 2017-10-06: qty 10

## 2017-10-06 MED ORDER — ACETAMINOPHEN 325 MG PO TABS: 650.0000 mg | ORAL_TABLET | ORAL | Status: DC | PRN

## 2017-10-06 MED ORDER — SODIUM CHLORIDE 0.9% FLUSH
3.0000 mL | Freq: Two times a day (BID) | INTRAVENOUS | Status: DC
  Administered 2017-10-06 – 2017-10-08 (×5): 3 mL via INTRAVENOUS

## 2017-10-06 MED ORDER — MIDAZOLAM HCL 2 MG/2ML IJ SOLN
INTRAMUSCULAR | Status: AC
  Filled 2017-10-06: qty 2

## 2017-10-06 MED ORDER — DIPHENHYDRAMINE HCL 50 MG/ML IJ SOLN
INTRAMUSCULAR | Status: AC
  Filled 2017-10-06: qty 1

## 2017-10-06 MED ORDER — HEPARIN (PORCINE) IN NACL 1000-0.9 UT/500ML-% IV SOLN
INTRAVENOUS | Status: AC
  Filled 2017-10-06: qty 1500

## 2017-10-06 MED ORDER — SODIUM CHLORIDE 0.9 % IV SOLN: 250.0000 mL | INTRAVENOUS | Status: DC | PRN

## 2017-10-06 SURGICAL SUPPLY — 23 items
BALLN SAPPHIRE 2.0X12 (BALLOONS) ×2
BALLN SAPPHIRE ~~LOC~~ 3.0X12 (BALLOONS) ×2 IMPLANT
BALLOON SAPPHIRE 2.0X12 (BALLOONS) ×1 IMPLANT
CATH INFINITI 5FR JL4 (CATHETERS) ×2 IMPLANT
CATH S G BIP PACING (SET/KITS/TRAYS/PACK) ×2 IMPLANT
CATH VISTA GUIDE 6FR JR4 (CATHETERS) ×2 IMPLANT
CATH-GARD ARROW CATH SHIELD (MISCELLANEOUS) ×2
GLIDESHEATH SLEND A-KIT 6F 22G (SHEATH) ×2 IMPLANT
GUIDEWIRE INQWIRE 1.5J.035X260 (WIRE) ×1 IMPLANT
INQWIRE 1.5J .035X260CM (WIRE) ×2
KIT HEART LEFT (KITS) ×2 IMPLANT
KIT HEMO VALVE WATCHDOG (MISCELLANEOUS) ×2 IMPLANT
KIT MICROPUNCTURE NIT STIFF (SHEATH) ×4 IMPLANT
PACK CARDIAC CATHETERIZATION (CUSTOM PROCEDURE TRAY) ×2 IMPLANT
SHEATH PINNACLE 6F 10CM (SHEATH) ×2 IMPLANT
SHEATH PROBE COVER 6X72 (BAG) ×2 IMPLANT
SHIELD CATHGARD ARROW (MISCELLANEOUS) ×1 IMPLANT
STENT SYNERGY DES 2.75X16 (Permanent Stent) ×2 IMPLANT
STENT SYNERGY DES 2.75X20 (Permanent Stent) ×2 IMPLANT
TRANSDUCER W/STOPCOCK (MISCELLANEOUS) ×2 IMPLANT
TUBING CIL FLEX 10 FLL-RA (TUBING) ×2 IMPLANT
WIRE ASAHI PROWATER 180CM (WIRE) ×2 IMPLANT
WIRE EMERALD 3MM-J .035X150CM (WIRE) ×2 IMPLANT

## 2017-10-06 NOTE — ED Triage Notes (Signed)
Patient called EMS with left chest pain, patient is pale, cold to touch, grey and hypotensive in the 70's.  Patient's HR in 40's.  Patient is being paced upon arrival to ED.  Patient is CAOx4 throughout transport.

## 2017-10-06 NOTE — Progress Notes (Addendum)
Pt heart Rate 40'S AND 50'S. Pt hypotensive systolic B/P IN 43'C.  Pt is hot and nauseous. Verbal order to give Benadryl 25 MG and Bolus of N/S 500. Ok to pull sheath.

## 2017-10-06 NOTE — Progress Notes (Signed)
[id number] Offered to get pt to recliner but pt still sleepy. Encouraged him to get up later with staff. MI education began with pt. Gave fake cigarette and smoking cessation handout. Pt stated he plans to quit. He will ask for nicotine patch if he feels that he needs one. Discussed NTG use, MI restrictions, stent and the importance of brilinta. Left heart healthy diet as pt has company and we can continue ed at another time. Encouraged him to get some rest as he has had several visitors today. Graylon Good RN BSN 10/06/2017 2:28 PM

## 2017-10-06 NOTE — H&P (Signed)
Adam [name] is an 52 y.o. male.   Chief Complaint: Chest pain HPI:  52 year old Caucasian male with tobacco abuse, prior history of hypertension, was brought by EMS today to [facility] ER. History obtained by EMS as patient is hypotensive and being actively paced transcutaneously for bradycardia and junctional escape rhythm. Patient called EMS at 1 PM on [phone] with complaints of chest pain. On their arrival, he was diaphoretic. EKG showed inferior ST elevation. He was thus taken emergently to cath lab.  Patient underwent successful culprit vessel PCI to proximal-mid RCA, as well as a non-culprit lesion in distal RCA. He has residual mild disease in mid and distal RCA. Patient also has severe 90% tandem stenoses in the mid LAD. Patient is currently chest pain-free and hemodynamically stable, maintaining sinus rhythm with 1:1 conduction.  Of note, patient mentions that he does not have insurance. This information was only available after patient was stabilized and PCI was performed.   Past Medical History:  Diagnosis Date  . Asthma   . Carpal tunnel syndrome on both sides   . Head injury, acute, with loss of consciousness ([place])    hit on head with bucket at work- bucket fell.  [name] History of kidney stones   . Hypertension    no- was up a couple times whene he went to Dr , told he doesnt have HTN    Past Surgical History:  Procedure Laterality Date  . CARPAL TUNNEL RELEASE Right 02/03/2013   Procedure: RIGHT CARPAL TUNNEL RELEASE;  Surgeon: Schuyler Amor, MD;  Location: [facility];  Service: Orthopedics;  Laterality: Right;  . ULNA OSTEOTOMY Right 02/03/2013   Procedure: RIGHT ULNAR SHORTENING OSTEOTOMY;  Surgeon: Schuyler Amor, MD;  Location: [facility];  Service: Orthopedics;  Laterality: Right;  . WISDOM TOOTH EXTRACTION  1999  . WOUND EXPLORATION Left 09/18/2012   Procedure: EXPLORE AND REPAIR AS NEEDED LEFT WRIST;  Surgeon: Schuyler Amor,  MD;  Location: [facility];  Service: Orthopedics;  Laterality: Left;  . WRIST ARTHROSCOPY Right 02/03/2013   Procedure: RIGHT WRIST ARTHROSCOPY WITH DEBRIDEMENT;  Surgeon: Schuyler Amor, MD;  Location: [facility];  Service: Orthopedics;  Laterality: Right;    Family History  Problem Relation Age of Onset  . Hypertension Father   . Cancer Father   . Heart failure Father    Social History:  reports that he has been smoking cigarettes. He has a 25.00 pack-year smoking history. He does not have any smokeless tobacco history on file. He reports that he drinks alcohol. He reports that he does not use drugs.  Allergies: No Known Allergies  Medications Prior to Admission  Medication Sig Dispense Refill  . gabapentin (NEURONTIN) 300 MG capsule TAKE ONE CAPSULE BY MOUTH 3 TIMES A DAY**DISCONTINUE LYRICA** (Patient not taking: Reported on 04/30/2017) 90 capsule 1  . oxyCODONE-acetaminophen (ROXICET) 5-325 MG per tablet Take 1 tablet by mouth every 4 (four) hours as needed for severe pain. (Patient not taking: Reported on 04/30/2017) 30 tablet 0  . predniSONE (STERAPRED UNI-PAK 48 TAB) 10 MG (48) TBPK tablet Take as directed. 48 tablet 0    Results for orders placed or performed during the hospital encounter of 10/06/17 (from the past 48 hour(s))  Comprehensive metabolic panel     Status: Abnormal   Collection Time: 10/06/17  2:15 AM  Result Value Ref Range   Sodium 144 135 - 145 mmol/L   Potassium 4.2 3.5 - 5.1  mmol/L   Chloride 106 98 - 111 mmol/L   CO2 20 (L) 22 - 32 mmol/L   Glucose, Bld 145 (H) 70 - 99 mg/dL   BUN 14 6 - 20 mg/dL   Creatinine, Ser 1.38 (H) 0.61 - 1.24 mg/dL   Calcium 8.4 (L) 8.9 - 10.3 mg/dL   Total Protein 6.0 (L) 6.5 - 8.1 g/dL   Albumin 3.3 (L) 3.5 - 5.0 g/dL   AST 44 (H) 15 - 41 U/L   ALT 37 0 - 44 U/L   Alkaline Phosphatase 73 38 - 126 U/L   Total Bilirubin 0.7 0.3 - 1.2 mg/dL   GFR calc non Af Amer 58 (L) >60 mL/min   GFR calc Af Amer >60 >60 mL/min     Comment: (NOTE) The eGFR has been calculated using the CKD EPI equation. This calculation has not been validated in all clinical situations. eGFR's persistently <60 mL/min signify possible Chronic Kidney Disease.    Anion gap 18 (H) 5 - 15    Comment: Performed at [facility], [place] [address] [place], [place] [address]  Lipid panel     Status: Abnormal   Collection Time: 10/06/17  2:15 AM  Result Value Ref Range   Cholesterol 218 (H) 0 - 200 mg/dL   Triglycerides 231 (H) <150 mg/dL   HDL 47 >40 mg/dL   Total CHOL/HDL Ratio 4.6 RATIO   VLDL 46 (H) 0 - 40 mg/dL   LDL Cholesterol 125 (H) 0 - 99 mg/dL    Comment:        Total Cholesterol/HDL:CHD Risk Coronary Heart Disease Risk Table                     Men   Women  1/2 Average Risk   3.4   3.3  Average Risk       5.0   4.4  2 X Average Risk   9.6   7.1  3 X Average Risk  23.4   11.0        Use the calculated Patient Ratio above and the CHD Risk Table to determine the patient's CHD Risk.        ATP III CLASSIFICATION (LDL):  <100     mg/dL   Optimal  100-129  mg/dL   Near or Above                    Optimal  130-159  mg/dL   Borderline  160-189  mg/dL   High  >190     mg/dL   Very High Performed at [facility] [address], [place], [place] [address]   CBC     Status: Abnormal   Collection Time: 10/06/17  2:15 AM  Result Value Ref Range   WBC 25.7 (H) 4.0 - 10.5 K/uL   RBC 4.68 4.22 - 5.81 MIL/uL   Hemoglobin 13.7 13.0 - 17.0 g/dL   HCT 43.0 39.0 - 52.0 %   MCV 91.9 78.0 - 100.0 fL   MCH 29.3 26.0 - 34.0 pg   MCHC 31.9 30.0 - 36.0 g/dL   RDW 14.6 11.5 - 15.5 %   Platelets 312 150 - 400 K/uL    Comment: Performed at [facility], [place] [address] [place], [place] [address]  Troponin I     Status: None   Collection Time: 10/06/17  2:15 AM  Result Value Ref Range   Troponin I <0.03 <0.03  ng/mL    Comment: Performed at [facility], [place] [address] [place], [place] [address]   No  results found.  Review of Systems  Constitutional: Negative.   HENT: Negative.   Respiratory: Positive for shortness of breath (Resolved after PCi).   Cardiovascular: Positive for chest pain (Resolved after PCI).  Gastrointestinal: Negative for nausea and vomiting.  Genitourinary: Negative.   Musculoskeletal: Negative.   Neurological: Negative.  Negative for dizziness and loss of consciousness.  Endo/Heme/Allergies: Does not bruise/bleed easily.  Psychiatric/Behavioral: Negative.   All other systems reviewed and are negative.   Blood pressure (!) 125/40, pulse 95, temperature 98.9 F (37.2 C), temperature source Oral, resp. rate 19, weight 68.5 kg, SpO2 100 %. Physical Exam  Nursing note and vitals reviewed. Constitutional: He appears well-developed and well-nourished. He appears distressed (Resolved after PCi).  Neck: Normal range of motion. Neck supple. No JVD present.  Cardiovascular: Normal rate, regular rhythm and normal heart sounds.  No murmur heard. Respiratory: Effort normal and breath sounds normal. He has no wheezes. He has no rales.  GI: Soft. Bowel sounds are normal. There is no tenderness. There is no rebound.  Musculoskeletal: Normal range of motion. He exhibits edema.  Lymphadenopathy:    He has no cervical adenopathy.  Skin: Skin is warm and dry.  Psychiatric: He has a normal mood and affect.     Assessment/Plan:  52 year old Caucasian male with tobacco abuse, history of hypertension, hyperlipidemia, admitted with inferior STEMI.  STEMI: Successful primary PCI to culprit vessel proximal-mid RCA, and distal RCA with synergy DES 3.5 x 16 mm, and 3.5 x 20 mm drug-eluting stents. Severe non-culprit stenosis and mid LAD, 90% tandem stenoses. He will need staged revascularization this hospital admission. Patient does not have insurance and will need financial assistance. Currently on aspirin and Brilinta, ideally recommended for at least one year.  Tobacco  abuse: Nicotine patch for now. I counseled the patient at length regarding smoking cessation in light of his life-threatening cardiac evemt.  Hypertension: Start lisinopril 2.5 mg daily, metoprolol 12.5 mg twice daily.  Hyperlipidemia: Crestor 20 mg daily  Financial barrier: Lack of insurance could significantly limit his medication compliance. Will see case Freight forwarder, social work consult.   Nigel Mormon, MD 10/06/2017, 4:07 AM  Nigel Mormon, MD [facility] [place] Pager: [phone] Office: [phone] If no answer Cell [phone]

## 2017-10-06 NOTE — Progress Notes (Signed)
I received a follow-up request from the On-Call Chaplain to visit with the patient and family. I provided spiritual support by sharing words of encouragement and lead in prayer. I shared with the patient and family members that the Chaplain is available for additional support if needed and requested.    10/06/17 1000  Clinical Encounter Type  Visited With Patient and family together  Visit Type Follow-up;Spiritual support  Referral From Nurse  Consult/Referral To Chaplain  Spiritual Encounters  Spiritual Needs Prayer  Stress Factors  Patient Stress Factors None identified  Family Stress Factors None identified   Chaplain Dr Redgie Grayer

## 2017-10-06 NOTE — Progress Notes (Signed)
  Echocardiogram 2D Echocardiogram has been performed.  Adam [name] 10/06/2017, 11:47 AM

## 2017-10-06 NOTE — Progress Notes (Addendum)
Subjective:  Nausea this morning. Now resolving. No chest pain. Some soreness, different from pre MI chest pain.  7 beat NSVT.  Objective:  Vital Signs in the last 24 hours: Temp:  [97.7 F (36.5 C)-98.9 F (37.2 C)] 98.5 F (36.9 C) (09/09 1643) Pulse Rate:  [47-152] 72 (09/09 2000) Resp:  [6-50] 19 (09/09 2000) BP: (48-128)/(32-86) 112/84 (09/09 2000) SpO2:  [0 %-100 %] 99 % (09/09 2000) Arterial Line BP: (138)/(84) 138/84 (09/09 0345) Weight:  [67.9 kg-68.5 kg] 67.9 kg (09/09 0436)  Intake/Output from previous day: 09/08 0701 - 09/09 0700 In: 2216.8 [I.V.:2216.8] Out: 750 [Urine:750] Intake/Output from this shift: No intake/output data recorded.  Physical Exam: Physical Exam  Constitutional: He is oriented to person, place, and time. He appears well-developed and well-nourished.  HENT:  Head: Atraumatic.  Eyes: Pupils are equal, round, and reactive to light. Conjunctivae are normal.  Neck: No JVD present.  Cardiovascular: Normal rate, regular rhythm and normal heart sounds.  No murmur heard. Pulmonary/Chest: Effort normal and breath sounds normal. He has no wheezes. He exhibits no tenderness.  Abdominal: Soft. Bowel sounds are normal. There is tenderness.  Musculoskeletal: Normal range of motion. He exhibits no edema.  Lymphadenopathy:    He has no cervical adenopathy.  Neurological: He is alert and oriented to person, place, and time.  Skin: Skin is warm and dry.  Psychiatric: He has a normal mood and affect.  Nursing note and vitals reviewed.    Lab Results: Recent Labs    10/06/17 0215 10/06/17 0229 10/06/17 0406  WBC 25.7*  --  20.7*  HGB 13.7 13.9 13.9  PLT 312  --  317   Recent Labs    10/06/17 0215 10/06/17 0229 10/06/17 0406  NA 144 126* 138  K 4.2 3.8 4.0  CL 106 89* 104  CO2 20*  --  23  GLUCOSE 145* 130* 102*  BUN 14 16 13   CREATININE 1.38* 1.10 1.07   Recent Labs    10/06/17 0215 10/06/17 0406  TROPONINI <0.03 56.45*   Hepatic  Function Panel Recent Labs    10/06/17 0215  PROT 6.0*  ALBUMIN 3.3*  AST 44*  ALT 37  ALKPHOS 73  BILITOT 0.7   Recent Labs    10/06/17 0215  CHOL 218*    Cardiac Studies: EKG 10/06/2017 Otherwise normal EKG Sinus bradycardia 57 bpm.   Echocardiogram 10/06/2017: Study Conclusions  - Left ventricle: The cavity size was normal. Wall thickness was   normal. Systolic function was mildly reduced. The estimated   ejection fraction was in the range of 45% to 50%. Mild   hypokinesis of the basalinferolateral myocardium. Left   ventricular diastolic function parameters were normal. - Right ventricle: Systolic function was low normal. - Inferior vena cava: The vessel was dilated. The respirophasic   diameter changes were blunted (< 50%), consistent with elevated   central venous pressure. - No significant valvular abnormalities.  Assessment/Plan:   52 y/o Caucasian male with inferior STEMI, s/p primary PCI.  STEMI: Successful primary PCI mid and distal RCA ASA 81 and brilinta 90 mg bid. Case management help appreciated. Hold ACEi, beta blocker for now, borderline low blood pressure. Plan for staged LAD revascularization 10/07/17.  Leukocytosis: No signs of infection. Possibly reactive.  Normal CXR and UA.  Monitor for now.   Tobacco abuse: Counseled regarding tobacco cessation. Offered nicotine patch, or chantix.   LOS: 0 days    Adam [name] Adam [name] 10/06/2017, 8:41 PM  Adam [name] Adam  Adam Jock, MD [facility] Pager: [phone] Office: [phone] If no answer Cell [phone]

## 2017-10-06 NOTE — Plan of Care (Addendum)
Pt currently in chair, family and visitor at bedside. Bath done per pt. Sheath was pulled at 0645 today, pt was placed on bedrest for 6 hours. Site at a level zero, no signs of hematoma or bleeding. Gauze dressing on site.No complaints of chest pain. No nausea or vomiting at this time. Pt does have very little appetite and states earlier this shift that stomach feels like it is in knots. MD Patwardhan aware. Pt vital signs are stable.  MD Patwardhan has given the verbal order to order a nicotine patch, 14mg  daily for pt if pt feels like he needs it. Case manager saw pt this shift as well as cardiac rehab, see notes.  Chest xray, ekg, as well as echo done this shift. Plan is to do another heart cath tomorrow regarding patients's LAD. No new changes at this time. Will continue to monitor.    Problem: Education: Goal: Knowledge of General Education information will improve Description Including pain rating scale, medication(s)/side effects and non-pharmacologic comfort measures Outcome: Progressing   Problem: Health Behavior/Discharge Planning: Goal: Ability to manage health-related needs will improve Outcome: Progressing   Problem: Clinical Measurements: Goal: Ability to maintain clinical measurements within normal limits will improve Outcome: Progressing Goal: Will remain free from infection Outcome: Progressing Goal: Diagnostic test results will improve Outcome: Progressing Goal: Respiratory complications will improve Outcome: Progressing Goal: Cardiovascular complication will be avoided Outcome: Progressing   Problem: Activity: Goal: Risk for activity intolerance will decrease Outcome: Progressing   Problem: Nutrition: Goal: Adequate nutrition will be maintained Outcome: Progressing   Problem: Coping: Goal: Level of anxiety will decrease Outcome: Progressing   Problem: Elimination: Goal: Will not experience complications related to bowel motility Outcome: Progressing Goal:  Will not experience complications related to urinary retention Outcome: Progressing   Problem: Pain Managment: Goal: General experience of comfort will improve Outcome: Progressing   Problem: Safety: Goal: Ability to remain free from injury will improve Outcome: Progressing   Problem: Skin Integrity: Goal: Risk for impaired skin integrity will decrease Outcome: Progressing

## 2017-10-06 NOTE — Progress Notes (Signed)
CRITICAL VALUE ALERT  Critical Value:  TROPONIN:56.45  Date & Time Notied:  10/06/2017 0530  Provider Notified: Einar Gip Orders Received/Actions taken: continue to monitor pt

## 2017-10-06 NOTE — ED Provider Notes (Signed)
[facility] CATH LAB Provider Note  CSN: [id number] Arrival date & time: 10/06/17 6962  Chief Complaint(s) Code STEMI  HPI Adam [name] is a 52 y.o. male with a history of hypertension presents to the emergency department as a code STEMI.  Per EMS patient called out for chest pain that have been ongoing since noon yesterday afternoon.  Pain worsened.  Associated with shortness of breath, nausea, and vomiting, diaphoresis.  In route patient became hypotensive and bradycardic requiring external pacing.  Aspirin was given.  Remainder of history, ROS, and physical exam limited due to patient's condition (Acuity of situation and patient unable to provide history while being paced). Additional information was obtained from EMS.   Level V Caveat.    HPI  Past Medical History Past Medical History:  Diagnosis Date  . Asthma   . Carpal tunnel syndrome on both sides   . Head injury, acute, with loss of consciousness ([place])    hit on head with bucket at work- bucket fell.  [name] History of kidney stones   . Hypertension    no- was up a couple times whene he went to Dr , told he doesnt have HTN   There are no active problems to display for this patient.  Home Medication(s) Prior to Admission medications   Medication Sig Start Date End Date Taking? Authorizing Provider  gabapentin (NEURONTIN) 300 MG capsule TAKE ONE CAPSULE BY MOUTH 3 TIMES A DAY**DISCONTINUE LYRICA** Patient not taking: Reported on 04/30/2017 02/03/13   Susy Frizzle, MD  oxyCODONE-acetaminophen (ROXICET) 5-325 MG per tablet Take 1 tablet by mouth every 4 (four) hours as needed for severe pain. Patient not taking: Reported on 04/30/2017 02/03/13   Charlotte Crumb, MD  predniSONE (STERAPRED UNI-PAK 48 TAB) 10 MG (48) TBPK tablet Take as directed. 04/30/17   Vanessa Kick, MD                                                                                                                                      Past Surgical History Past Surgical History:  Procedure Laterality Date  . CARPAL TUNNEL RELEASE Right 02/03/2013   Procedure: RIGHT CARPAL TUNNEL RELEASE;  Surgeon: Schuyler Amor, MD;  Location: [place];  Service: Orthopedics;  Laterality: Right;  . ULNA OSTEOTOMY Right 02/03/2013   Procedure: RIGHT ULNAR SHORTENING OSTEOTOMY;  Surgeon: Schuyler Amor, MD;  Location: [facility];  Service: Orthopedics;  Laterality: Right;  . WISDOM TOOTH EXTRACTION  1999  . WOUND EXPLORATION Left 09/18/2012   Procedure: EXPLORE AND REPAIR AS NEEDED LEFT WRIST;  Surgeon: Schuyler Amor, MD;  Location: [place];  Service: Orthopedics;  Laterality: Left;  . WRIST ARTHROSCOPY Right 02/03/2013   Procedure: RIGHT WRIST ARTHROSCOPY WITH DEBRIDEMENT;  Surgeon: Schuyler Amor, MD;  Location: [place];  Service: Orthopedics;  Laterality: Right;   Family History  Family History  Problem Relation Age of Onset  . Hypertension Father   . Cancer Father   . Heart failure Father     Social History Social History   Tobacco Use  . Smoking status: Current Every Day Smoker    Packs/day: 1.00    Years: 25.00    Pack years: 25.00    Types: Cigarettes  Substance Use Topics  . Alcohol use: Yes    Comment: maybe 6 beers a month  . Drug use: No   Allergies Patient has no known allergies.  Review of Systems Review of Systems  Unable to perform ROS: Acuity of condition    Physical Exam Vital Signs  I have reviewed the triage vital signs BP (!) 48/32   Pulse (!) 58   Resp 20   Wt 68.5 kg   BMI 25.92 kg/m    Physical Exam  Constitutional: He appears well-developed and well-nourished. No distress.  HENT:  Head: Normocephalic and atraumatic.  Nose: Nose normal.  Eyes: Pupils are equal, round, and reactive to light. Conjunctivae and EOM are normal. Right eye exhibits no discharge. Left eye exhibits no discharge. No scleral icterus.  Neck: Normal range  of motion. Neck supple.  Cardiovascular: Regular rhythm. Bradycardia present. Exam reveals no gallop and no friction rub.  No murmur heard. Pulmonary/Chest: Effort normal and breath sounds normal. No stridor. No respiratory distress. He has no rales.  Abdominal: Soft. He exhibits no distension. There is no tenderness.  Musculoskeletal: He exhibits no edema or tenderness.  Neurological: He is alert.  Skin: Skin is warm. No rash noted. He is diaphoretic. No erythema. There is pallor.  Psychiatric: He has a normal mood and affect.  Vitals reviewed.   ED Results and Treatments Labs (all labs ordered are listed, but only abnormal results are displayed) Labs Reviewed - No data to display                                                                                                                       EKG from EMS  EKG Interpretation  Date/Time: 10/06/2017  01:20:17   Ventricular Rate:   40 PR Interval: 254   QRS Duration: 86   QT Interval: 462   QTC Calculation:427   R Axis:  85   Text Interpretation: ST segment elevation in inferior leads with reciprocal changes.  Prolonged PR interval con  consistent with 1st degree AV block      Radiology No results found. Pertinent labs & imaging results that were available during my care of the patient were reviewed by me and considered in my medical decision making (see chart for details).  Medications Ordered in ED Medications - No data to display  Procedures Procedures CRITICAL CARE Performed by: Grayce Sessions Cardama Total critical care time: 10 minutes Critical care time was exclusive of separately billable procedures and treating other patients. Critical care was necessary to treat or prevent imminent or life-threatening deterioration. Critical care was time spent personally by me on the following activities:  development of treatment plan with patient and/or surrogate as well as nursing, discussions with consultants, evaluation of patient's response to treatment, examination of patient, obtaining history from patient or surrogate, ordering and performing treatments and interventions, ordering and review of laboratory studies, ordering and review of radiographic studies, pulse oximetry and re-evaluation of patient's condition.   (including critical care time)  Medical Decision Making / ED Course I have reviewed the nursing notes for this encounter and the patient's prior records (if available in EHR or on provided paperwork).    Code STEMI.  Appears to be RCA.  Requiring pacing.  Patient kept on EMS pacer.  Provided with additional IV fluids.  Cardiology down to evaluate the patient within 10 minutes and patient was taken up to the Cath Lab immediately.  Final Clinical Impression(s) / ED Diagnoses Final diagnoses:  ST elevation myocardial infarction involving right coronary artery [facility])      This chart was dictated using voice recognition software.  Despite best efforts to proofread,  errors can occur which can change the documentation meaning.   Fatima Blank, MD 10/06/17 [phone]

## 2017-10-06 NOTE — Care Management Note (Addendum)
Case Management Note  Patient Details  Name: Adam [name] MRN: [id number] Date of Birth: 1965/03/30  Subjective/Objective:  STEMI                  Action/Plan: CM met with patient/significant other to discuss POC. Patient lives at home, independent with ADLs, with no DME in use. Local Pharmacy: [facility] [place] CM verified patient was uninsured; Brilinta 30 day free card and [employer] prescription Savings application provided to significant other-advised to send the completed application to [employer]. Prescriber's section of the Rx application placed on patient's shadow chart, with request to return to patient once completed. PCP f/u appoinment scheduled with Patient [name], October 15, 2017 @ 0900; AVS updated. Patient can utilize [facility] for $4-$10 medications. Significant other will provide transportation home. CM will continue to follow.   Expected Discharge Date:                  Expected Discharge Plan:  Home/Self Care  In-House Referral:  NA  Discharge planning Services  CM Consult, Medication Assistance(Brilinta 30 day card)  Post Acute Care Choice:  NA Choice offered to:  NA  DME Arranged:  N/A DME Agency:  NA  HH Arranged:  NA HH Agency:  NA  Status of Service:  In process, will continue to follow  If discussed at Long Length of Stay Meetings, dates discussed:    Additional Comments:  Adam Minium RN, BSN, NCM-BC, ACM-RN [phone] 10/06/2017, 12:48 PM

## 2017-10-06 NOTE — Progress Notes (Signed)
Chaplain responded to Code Stemi of this patient, upon arrival to the ED he was taken directly to the Cath Lab. Chaplain did not have contact with the patient at this time due to the urgency of his need to go directly to the Cath Lab. Bonney Roussel will follow up at a later time. Chaplain Yaakov Guthrie [phone]

## 2017-10-06 NOTE — Progress Notes (Signed)
Right femoral arterial sheath and venous sheath pulled. Manual pressure held and hemostasis achieved after 20 min. No hematoma or complication noted. Groin site a level 0 pre and post sheath pull. Patient tolerated well. Vital signs remained consistent with pre-sheath pull. Patient educated on holding pressure to right groin site when coughing, sneezing, or bearing down. Patient demonstrated understanding.

## 2017-10-07 ENCOUNTER — Encounter (HOSPITAL_COMMUNITY)
Admission: EM | Disposition: A | Discharge status: Home / Self Care | Payer: Self-pay | Source: Home / Self Care | Attending: Cardiology

## 2017-10-07 ENCOUNTER — Encounter (HOSPITAL_COMMUNITY): Payer: Self-pay | Admitting: Cardiology

## 2017-10-07 HISTORY — PX: CORONARY STENT INTERVENTION: CATH118234

## 2017-10-07 LAB — POCT ACTIVATED CLOTTING TIME
Activated Clotting Time: 329 seconds
Activated Clotting Time: 329 seconds

## 2017-10-07 LAB — BASIC METABOLIC PANEL
Anion gap: 7 (ref 5–15)
BUN: 8 mg/dL (ref 6–20)
CHLORIDE: 108 mmol/L (ref 98–111)
CO2: 21 mmol/L — ABNORMAL LOW (ref 22–32)
Calcium: 8.1 mg/dL — ABNORMAL LOW (ref 8.9–10.3)
Creatinine, Ser: 0.81 mg/dL (ref 0.61–1.24)
GFR calc Af Amer: >60 mL/min (ref >60)
Glucose, Bld: 109 mg/dL — ABNORMAL HIGH (ref 70–99)
Potassium: 4 mmol/L (ref 3.5–5.1)
SODIUM: 136 mmol/L (ref 135–145)

## 2017-10-07 LAB — HEMOGLOBIN A1C
Hgb A1c MFr Bld: 5.5 % (ref 4.8–5.6)
Hgb A1c MFr Bld: 5.5 % (ref 4.8–5.6)
Mean Plasma Glucose: 111 mg/dL
Mean Plasma Glucose: 111 mg/dL

## 2017-10-07 LAB — CBC
HCT: 35.6 % — ABNORMAL LOW (ref 39.0–52.0)
Hemoglobin: 12 g/dL — ABNORMAL LOW (ref 13.0–17.0)
MCH: 29.9 pg (ref 26.0–34.0)
MCHC: 33.7 g/dL (ref 30.0–36.0)
MCV: 88.8 fL (ref 78.0–100.0)
Platelets: 251 10*3/uL (ref 150–400)
RBC: 4.01 MIL/uL — ABNORMAL LOW (ref 4.22–5.81)
RDW: 14.6 % (ref 11.5–15.5)
WBC: 18.1 10*3/uL — ABNORMAL HIGH (ref 4.0–10.5)

## 2017-10-07 LAB — GLUCOSE, CAPILLARY
Glucose-Capillary: 112 mg/dL — ABNORMAL HIGH (ref 70–99)
Glucose-Capillary: 91 mg/dL (ref 70–99)

## 2017-10-07 SURGERY — CORONARY STENT INTERVENTION
Anesthesia: LOCAL

## 2017-10-07 MED ORDER — HYDRALAZINE HCL 20 MG/ML IJ SOLN: 5.0000 mg | INTRAMUSCULAR | Status: AC | PRN

## 2017-10-07 MED ORDER — SODIUM CHLORIDE 0.9 % IV SOLN: INTRAVENOUS | Status: DC

## 2017-10-07 MED ORDER — SODIUM CHLORIDE 0.9% FLUSH: 3.0000 mL | INTRAVENOUS | Status: DC | PRN

## 2017-10-07 MED ORDER — VERAPAMIL HCL 2.5 MG/ML IV SOLN
INTRAVENOUS | Status: DC | PRN
  Administered 2017-10-07: 3 mL via INTRA_ARTERIAL

## 2017-10-07 MED ORDER — IOPAMIDOL (ISOVUE-370) INJECTION 76%
INTRAVENOUS | Status: DC | PRN
  Administered 2017-10-07: 100 mL via INTRA_ARTERIAL

## 2017-10-07 MED ORDER — HEPARIN (PORCINE) IN NACL 1000-0.9 UT/500ML-% IV SOLN
INTRAVENOUS | Status: DC | PRN
  Administered 2017-10-07 (×2): 500 mL

## 2017-10-07 MED ORDER — VERAPAMIL HCL 2.5 MG/ML IV SOLN
INTRAVENOUS | Status: AC
  Filled 2017-10-07: qty 2

## 2017-10-07 MED ORDER — NITROGLYCERIN 1 MG/10 ML FOR IR/CATH LAB
INTRA_ARTERIAL | Status: AC
  Filled 2017-10-07: qty 10

## 2017-10-07 MED ORDER — SODIUM CHLORIDE 0.9% FLUSH
3.0000 mL | Freq: Two times a day (BID) | INTRAVENOUS | Status: DC
  Administered 2017-10-08: 3 mL via INTRAVENOUS

## 2017-10-07 MED ORDER — SODIUM CHLORIDE 0.9 % IV SOLN
INTRAVENOUS | Status: AC
  Administered 2017-10-07: 16:00:00 via INTRAVENOUS

## 2017-10-07 MED ORDER — FENTANYL CITRATE (PF) 100 MCG/2ML IJ SOLN
INTRAMUSCULAR | Status: DC | PRN
  Administered 2017-10-07: 25 ug via INTRAVENOUS

## 2017-10-07 MED ORDER — SODIUM CHLORIDE 0.9 % IV SOLN: 250.0000 mL | INTRAVENOUS | Status: DC | PRN

## 2017-10-07 MED ORDER — LIDOCAINE HCL (PF) 1 % IJ SOLN
INTRAMUSCULAR | Status: AC
  Filled 2017-10-07: qty 30

## 2017-10-07 MED ORDER — ONDANSETRON HCL 4 MG/2ML IJ SOLN
4.0000 mg | Freq: Four times a day (QID) | INTRAMUSCULAR | Status: DC | PRN
  Administered 2017-10-07 – 2017-10-08 (×2): 4 mg via INTRAVENOUS
  Filled 2017-10-07 (×2): qty 2

## 2017-10-07 MED ORDER — IOPAMIDOL (ISOVUE-370) INJECTION 76%
INTRAVENOUS | Status: AC
  Filled 2017-10-07: qty 150

## 2017-10-07 MED ORDER — HEPARIN SODIUM (PORCINE) 1000 UNIT/ML IJ SOLN
INTRAMUSCULAR | Status: DC | PRN
  Administered 2017-10-07: 7000 [IU] via INTRAVENOUS

## 2017-10-07 MED ORDER — LABETALOL HCL 5 MG/ML IV SOLN: 10.0000 mg | INTRAVENOUS | Status: AC | PRN

## 2017-10-07 MED ORDER — SODIUM CHLORIDE 0.9% FLUSH
3.0000 mL | Freq: Two times a day (BID) | INTRAVENOUS | Status: DC
  Administered 2017-10-07: 3 mL via INTRAVENOUS

## 2017-10-07 MED ORDER — ACETAMINOPHEN 325 MG PO TABS
650.0000 mg | ORAL_TABLET | ORAL | Status: DC | PRN
  Administered 2017-10-08: 650 mg via ORAL
  Filled 2017-10-07: qty 2

## 2017-10-07 MED ORDER — MIDAZOLAM HCL 2 MG/2ML IJ SOLN
INTRAMUSCULAR | Status: DC | PRN
  Administered 2017-10-07: 1 mg via INTRAVENOUS

## 2017-10-07 MED ORDER — HEPARIN SODIUM (PORCINE) 1000 UNIT/ML IJ SOLN
INTRAMUSCULAR | Status: AC
  Filled 2017-10-07: qty 1

## 2017-10-07 MED ORDER — HEPARIN (PORCINE) IN NACL 1000-0.9 UT/500ML-% IV SOLN
INTRAVENOUS | Status: AC
  Filled 2017-10-07: qty 1000

## 2017-10-07 MED ORDER — LISINOPRIL 5 MG PO TABS
5.0000 mg | ORAL_TABLET | Freq: Every day | ORAL | Status: DC
  Administered 2017-10-08: 5 mg via ORAL
  Filled 2017-10-07: qty 1

## 2017-10-07 MED ORDER — MIDAZOLAM HCL 2 MG/2ML IJ SOLN
INTRAMUSCULAR | Status: AC
  Filled 2017-10-07: qty 2

## 2017-10-07 MED ORDER — NITROGLYCERIN 1 MG/10 ML FOR IR/CATH LAB
INTRA_ARTERIAL | Status: DC | PRN
  Administered 2017-10-07: 200 ug via INTRACORONARY

## 2017-10-07 MED ORDER — LIDOCAINE HCL (PF) 1 % IJ SOLN
INTRAMUSCULAR | Status: DC | PRN
  Administered 2017-10-07: 2 mL

## 2017-10-07 MED ORDER — FENTANYL CITRATE (PF) 100 MCG/2ML IJ SOLN
INTRAMUSCULAR | Status: AC
  Filled 2017-10-07: qty 2

## 2017-10-07 MED FILL — Verapamil HCl IV Soln 2.5 MG/ML: INTRAVENOUS | Qty: 2 | Status: AC

## 2017-10-07 MED FILL — Lidocaine HCl Local Preservative Free (PF) Inj 1%: INTRAMUSCULAR | Qty: 30 | Status: AC

## 2017-10-07 MED FILL — Heparin Sodium (Porcine) Inj 1000 Unit/ML: INTRAMUSCULAR | Qty: 10 | Status: AC

## 2017-10-07 SURGICAL SUPPLY — 18 items
BALLN SAPPHIRE 2.5X15 (BALLOONS) ×2
BALLN ~~LOC~~ EMERGE MR 3.5X20 (BALLOONS) ×2
BALLOON SAPPHIRE 2.5X15 (BALLOONS) ×1 IMPLANT
BALLOON ~~LOC~~ EMERGE MR 3.5X20 (BALLOONS) ×1 IMPLANT
CATH LAUNCHER 6FR EBU3.5 (CATHETERS) ×2 IMPLANT
DEVICE RAD COMP TR BAND LRG (VASCULAR PRODUCTS) ×2 IMPLANT
ELECT DEFIB PAD ADLT CADENCE (PAD) ×2 IMPLANT
GLIDESHEATH SLEND A-KIT 6F 22G (SHEATH) ×2 IMPLANT
GUIDEWIRE INQWIRE 1.5J.035X260 (WIRE) ×1 IMPLANT
INQWIRE 1.5J .035X260CM (WIRE) ×2
KIT ENCORE 26 ADVANTAGE (KITS) ×2 IMPLANT
KIT HEART LEFT (KITS) ×2 IMPLANT
KIT HEMO VALVE WATCHDOG (MISCELLANEOUS) ×2 IMPLANT
PACK CARDIAC CATHETERIZATION (CUSTOM PROCEDURE TRAY) ×2 IMPLANT
STENT SYNERGY DES 3.5X38 (Permanent Stent) ×2 IMPLANT
TRANSDUCER W/STOPCOCK (MISCELLANEOUS) ×2 IMPLANT
TUBING CIL FLEX 10 FLL-RA (TUBING) ×2 IMPLANT
WIRE COUGAR XT STRL 190CM (WIRE) ×2 IMPLANT

## 2017-10-07 NOTE — Interval H&P Note (Signed)
History and Physical Interval Note:  10/07/2017 2:44 PM  Adam [name]  has presented today for surgery, with the diagnosis of cad  The various methods of treatment have been discussed with the patient and family. After consideration of risks, benefits and other options for treatment, the patient has consented to  Procedure(s): CORONARY STENT INTERVENTION (N/A) as a surgical intervention .  The patient's history has been reviewed, patient examined, no change in status, stable for surgery.  I have reviewed the patient's chart and labs.  Questions were answered to the patient's satisfaction.    2016 Appropriate Use Criteria for Coronary Revascularization in Patients With Acute Coronary Syndrome STEMI Revascularization Strategy Link Here: [employer]ee Patient Information: Revascularization Strategy Revascularization of non-culprit artery during the initial hospitalization after successful treatment of the culprit artery by primary PCI or fibrinolytic therapy  Spontaneous or easily provoked symptoms of myocardial ischemia Yes  Noninvasive testing for ischemia Not performed  One or more additional severe (>=70% DS) stenoses present Yes  One or more additional intermediate (50-69% DS) stenoses present No  Indication: PCI or CABG of 1 or more non-culprit arteries during the same hospitalization following successful treatment of STEMI culprit artery by primary PCI or fibrinolytic therapy in a patient with  Spontaneous or easily provoked symptoms of myocardial ischemia: Yes One or more additional severe (>=70% DS) stenoses present: Yes A (8) Indication: 10; Score 8     [place]

## 2017-10-07 NOTE — Progress Notes (Addendum)
Subjective:  Sleeping peacefully this morning.  No chest pain, as per the nurse.   Objective:  Vital Signs in the last 24 hours: Temp:  [97.7 F (36.5 C)-99.1 F (37.3 C)] 98.5 F (36.9 C) (09/10 0400) Pulse Rate:  [54-82] 72 (09/10 1100) Resp:  [13-26] 26 (09/10 1100) BP: (90-143)/(58-99) 143/95 (09/10 1100) SpO2:  [94 %-100 %] 98 % (09/10 1100)  Intake/Output from previous day: 09/09 0701 - 09/10 0700 In: 1378.2 [P.O.:120; I.V.:1258.2] Out: 350 [Urine:350] Intake/Output from this shift: Total I/O In: 2095.5 [I.V.:2095.5] Out: 325 [Urine:325]  Physical Exam: Physical Exam  Constitutional: He is oriented to person, place, and time. He appears well-developed and well-nourished.  HENT:  Head: Atraumatic.  Eyes: Pupils are equal, round, and reactive to light. Conjunctivae are normal.  Neck: No JVD present.  Cardiovascular: Normal rate, regular rhythm and normal heart sounds.  No murmur heard. Pulmonary/Chest: Effort normal and breath sounds normal. He has no wheezes. He exhibits no tenderness.  Abdominal: Soft. Bowel sounds are normal. There is no tenderness.  Musculoskeletal: Normal range of motion. He exhibits no edema.  Lymphadenopathy:    He has no cervical adenopathy.  Neurological: He is alert and oriented to person, place, and time.  Skin: Skin is warm and dry.  Psychiatric: He has a normal mood and affect.  Nursing note and vitals reviewed.    Lab Results: Recent Labs    10/06/17 0406 10/07/17 0847  WBC 20.7* 18.1*  HGB 13.9 12.0*  PLT 317 251   Recent Labs    10/06/17 0406 10/07/17 0330  NA 138 136  K 4.0 4.0  CL 104 108  CO2 23 21*  GLUCOSE 102* 109*  BUN 13 8  CREATININE 1.07 0.81   Recent Labs    10/06/17 0215 10/06/17 0406  TROPONINI <0.03 56.45*   Hepatic Function Panel Recent Labs    10/06/17 0215  PROT 6.0*  ALBUMIN 3.3*  AST 44*  ALT 37  ALKPHOS 73  BILITOT 0.7   Recent Labs    10/06/17 0215  CHOL 218*    Cardiac  Studies: EKG 10/06/2017 Otherwise normal EKG Sinus bradycardia 57 bpm.   Echocardiogram 10/06/2017: Study Conclusions  - Left ventricle: The cavity size was normal. Wall thickness was   normal. Systolic function was mildly reduced. The estimated   ejection fraction was in the range of 45% to 50%. Mild   hypokinesis of the basalinferolateral myocardium. Left   ventricular diastolic function parameters were normal. - Right ventricle: Systolic function was low normal. - Inferior vena cava: The vessel was dilated. The respirophasic   diameter changes were blunted (< 50%), consistent with elevated   central venous pressure. - No significant valvular abnormalities.  Assessment/Plan:   52 y/o Caucasian male with inferior STEMI, s/p primary PCI.  STEMI: Successful primary PCI mid and distal RCA ASA 81 and brilinta 90 mg bid. Case management help appreciated. Lisinopril 5 mg, metoprolol tartarate 12.5 mg bid.  Plan for staged LAD revascularization later today.  Leukocytosis: No signs of infection. Possibly reactive. Improving. Normal CXR and UA.  Monitor for now.   Tobacco abuse: Counseled regarding tobacco cessation. Offered nicotine patch, or chantix.   LOS: 1 day    Marleena Shubert J Ellysa Parrack 10/07/2017, 11:05 AM  [place], MD [facility] [place] Pager: [phone] Office: [phone] If no answer Cell [phone]

## 2017-10-07 NOTE — H&P (View-Only) (Signed)
Subjective:  Sleeping peacefully this morning.  No chest pain, as per the nurse.   Objective:  Vital Signs in the last 24 hours: Temp:  [97.7 F (36.5 C)-99.1 F (37.3 C)] 98.5 F (36.9 C) (09/10 0400) Pulse Rate:  [54-82] 72 (09/10 1100) Resp:  [13-26] 26 (09/10 1100) BP: (90-143)/(58-99) 143/95 (09/10 1100) SpO2:  [94 %-100 %] 98 % (09/10 1100)  Intake/Output from previous day: 09/09 0701 - 09/10 0700 In: 1378.2 [P.O.:120; I.V.:1258.2] Out: 350 [Urine:350] Intake/Output from this shift: Total I/O In: 2095.5 [I.V.:2095.5] Out: 325 [Urine:325]  Physical Exam: Physical Exam  Constitutional: He is oriented to person, place, and time. He appears well-developed and well-nourished.  HENT:  Head: Atraumatic.  Eyes: Pupils are equal, round, and reactive to light. Conjunctivae are normal.  Neck: No JVD present.  Cardiovascular: Normal rate, regular rhythm and normal heart sounds.  No murmur heard. Pulmonary/Chest: Effort normal and breath sounds normal. He has no wheezes. He exhibits no tenderness.  Abdominal: Soft. Bowel sounds are normal. There is tenderness.  Musculoskeletal: Normal range of motion. He exhibits no edema.  Lymphadenopathy:    He has no cervical adenopathy.  Neurological: He is alert and oriented to person, place, and time.  Skin: Skin is warm and dry.  Psychiatric: He has a normal mood and affect.  Nursing note and vitals reviewed.    Lab Results: Recent Labs    10/06/17 0406 10/07/17 0847  WBC 20.7* 18.1*  HGB 13.9 12.0*  PLT 317 251   Recent Labs    10/06/17 0406 10/07/17 0330  NA 138 136  K 4.0 4.0  CL 104 108  CO2 23 21*  GLUCOSE 102* 109*  BUN 13 8  CREATININE 1.07 0.81   Recent Labs    10/06/17 0215 10/06/17 0406  TROPONINI <0.03 56.45*   Hepatic Function Panel Recent Labs    10/06/17 0215  PROT 6.0*  ALBUMIN 3.3*  AST 44*  ALT 37  ALKPHOS 73  BILITOT 0.7   Recent Labs    10/06/17 0215  CHOL 218*    Cardiac  Studies: EKG 10/06/2017 Otherwise normal EKG Sinus bradycardia 57 bpm.   Echocardiogram 10/06/2017: Study Conclusions  - Left ventricle: The cavity size was normal. Wall thickness was   normal. Systolic function was mildly reduced. The estimated   ejection fraction was in the range of 45% to 50%. Mild   hypokinesis of the basalinferolateral myocardium. Left   ventricular diastolic function parameters were normal. - Right ventricle: Systolic function was low normal. - Inferior vena cava: The vessel was dilated. The respirophasic   diameter changes were blunted (< 50%), consistent with elevated   central venous pressure. - No significant valvular abnormalities.  Assessment/Plan:   52 y/o Caucasian male with inferior STEMI, s/p primary PCI.  STEMI: Successful primary PCI mid and distal RCA ASA 81 and brilinta 90 mg bid. Case management help appreciated. Lisinopril 5 mg, metoprolol tartarate 12.5 mg bid.  Plan for staged LAD revascularization later today.  Leukocytosis: No signs of infection. Possibly reactive. Improving. Normal CXR and UA.  Monitor for now.   Tobacco abuse: Counseled regarding tobacco cessation. Offered nicotine patch, or chantix.   LOS: 1 day    Kamau Weatherall J Naresh Althaus 10/07/2017, 11:05 AM  [place], MD [facility] [place] Pager: [phone] Office: [phone] If no answer Cell [phone]

## 2017-10-07 NOTE — Progress Notes (Signed)
CARDIAC REHAB PHASE I   Reinforced MI education with pt and wife. Reviewed importance of ASA, Brilinta, statin, and  NTG. Educated pt on importance of risk factor modification, especially with strong family history of heart disease. Pt states he "is done" with cigarettes. Pt given heart healthy diet. Pt educated if it comes out of a fast food bag, its not heart healthy. Pt and wife need reinforcement on what constitutes as heart healthy. Will send CRP II referral to [place]. Pt for staged PCI today. Will continue to follow.  [phone] Rufina Falco, RN BSN 10/07/2017 11:30 AM

## 2017-10-08 DIAGNOSIS — E782 Mixed hyperlipidemia: Secondary | ICD-10-CM

## 2017-10-08 DIAGNOSIS — I4949 Other premature depolarization: Secondary | ICD-10-CM

## 2017-10-08 DIAGNOSIS — E785 Hyperlipidemia, unspecified: Secondary | ICD-10-CM

## 2017-10-08 DIAGNOSIS — Z720 Tobacco use: Secondary | ICD-10-CM | POA: Diagnosis present

## 2017-10-08 DIAGNOSIS — D72829 Elevated white blood cell count, unspecified: Secondary | ICD-10-CM | POA: Diagnosis present

## 2017-10-08 LAB — BASIC METABOLIC PANEL
Anion gap: 9 (ref 5–15)
BUN: 7 mg/dL (ref 6–20)
CALCIUM: 8.2 mg/dL — AB (ref 8.9–10.3)
CO2: 21 mmol/L — ABNORMAL LOW (ref 22–32)
Chloride: 107 mmol/L (ref 98–111)
Creatinine, Ser: 0.79 mg/dL (ref 0.61–1.24)
GFR calc non Af Amer: >60 mL/min (ref >60)
Glucose, Bld: 97 mg/dL (ref 70–99)
Potassium: 3.8 mmol/L (ref 3.5–5.1)
SODIUM: 137 mmol/L (ref 135–145)

## 2017-10-08 LAB — CBC
HCT: 36.6 % — ABNORMAL LOW (ref 39.0–52.0)
Hemoglobin: 12.1 g/dL — ABNORMAL LOW (ref 13.0–17.0)
MCH: 29.3 pg (ref 26.0–34.0)
MCHC: 33.1 g/dL (ref 30.0–36.0)
MCV: 88.6 fL (ref 78.0–100.0)
Platelets: 259 10*3/uL (ref 150–400)
RBC: 4.13 MIL/uL — AB (ref 4.22–5.81)
RDW: 14.4 % (ref 11.5–15.5)
WBC: 19.2 10*3/uL — AB (ref 4.0–10.5)

## 2017-10-08 MED ORDER — NITROGLYCERIN 0.4 MG SL SUBL: 0.4000 mg | SUBLINGUAL_TABLET | SUBLINGUAL | 3 refills | Status: DC | PRN

## 2017-10-08 MED ORDER — TICAGRELOR 90 MG PO TABS: 90.0000 mg | ORAL_TABLET | Freq: Two times a day (BID) | ORAL | 0 refills | Status: DC

## 2017-10-08 MED ORDER — NITROGLYCERIN 0.4 MG SL SUBL: 0.4000 mg | SUBLINGUAL_TABLET | SUBLINGUAL | Status: DC | PRN

## 2017-10-08 MED ORDER — ASPIRIN 81 MG PO CHEW: 81.0000 mg | CHEWABLE_TABLET | Freq: Every day | ORAL | 3 refills | Status: DC

## 2017-10-08 MED ORDER — ROSUVASTATIN CALCIUM 20 MG PO TABS: 20.0000 mg | ORAL_TABLET | Freq: Every day | ORAL | 3 refills | Status: DC

## 2017-10-08 MED ORDER — TICAGRELOR 90 MG PO TABS: 90.0000 mg | ORAL_TABLET | Freq: Two times a day (BID) | ORAL | 3 refills | Status: DC

## 2017-10-08 MED ORDER — NITROGLYCERIN 0.4 MG SL SUBL: 0.4000 mg | SUBLINGUAL_TABLET | SUBLINGUAL | 1 refills | Status: DC | PRN

## 2017-10-08 MED ORDER — PANTOPRAZOLE SODIUM 20 MG PO TBEC: 20.0000 mg | DELAYED_RELEASE_TABLET | Freq: Every day | ORAL | 1 refills | Status: DC

## 2017-10-08 MED ORDER — LISINOPRIL 5 MG PO TABS: 5.0000 mg | ORAL_TABLET | Freq: Every day | ORAL | 3 refills | Status: DC

## 2017-10-08 MED ORDER — METOPROLOL TARTRATE 25 MG PO TABS: 12.5000 mg | ORAL_TABLET | Freq: Two times a day (BID) | ORAL | 3 refills | Status: DC

## 2017-10-08 NOTE — Progress Notes (Signed)
Discharge instructions Reviewed with the patient and wife to include medications, prescriptions, follow up visits, activity and diet.  Both voice understanding to teaching.  Patient ambulatory to the door.  Home via [place] with his wife driving.

## 2017-10-08 NOTE — Progress Notes (Signed)
Pt's girlfriend Mariann Laster called after patient arrived at the pharmacy.  The patients medications were going to be over $250 and they did not have the money to get them tonight.  Discussed the importance of the Brilinta.  She agreed to go back and get the Brilinta, that pt will get 30 days free, and we came up with a plan for her to either pick up the metoprolol there or got to [place], where it looks like the cost would be $10.  They will follow up with the [facility] and wellness in the AM to get the rest of the medications.  I told her to call the unit back if she had other problems, or use the MD phone number on the D/C instructions.    Spoke with Dr Virgina Jock who agreed with the above.    Karsten Ro, RN

## 2017-10-08 NOTE — Progress Notes (Signed)
CRITICAL VALUE ALERT  Critical Value: troponin, 29.40  Date & Time Notied: 10/08/17 1052  Provider Notified: Did not notify, expected results.

## 2017-10-08 NOTE — Plan of Care (Signed)
  Problem: Education: Goal: Knowledge of General Education information will improve Description: Including pain rating scale, medication(s)/side effects and non-pharmacologic comfort measures Outcome: Progressing   Problem: Clinical Measurements: Goal: Ability to maintain clinical measurements within normal limits will improve Outcome: Progressing Goal: Will remain free from infection Outcome: Progressing Goal: Diagnostic test results will improve Outcome: Progressing   Problem: Activity: Goal: Risk for activity intolerance will decrease Outcome: Progressing   

## 2017-10-08 NOTE — Discharge Summary (Addendum)
Physician Discharge Summary  Patient ID: Adam [name] MRN: [id number] DOB/AGE: [birth year] 52 y.o.  Admit date: 10/06/2017 Discharge date: 10/08/2017  Admission Diagnoses: STEMI  Discharge Diagnoses:  Active Problems:   STEMI (ST elevation myocardial infarction) ([place])   Tobacco abuse   Leukocytosis   Hyperlipidemia   Discharged Condition:  Stable  Hospital Course:   52 year old Caucasian male with tobacco abuse, admitted with inferior STEMI causing sinus pause and junctional escape rhythm, hypotension on 10/06/2017 early morning.  Patient underwent temporary transvenous pacemaker and successful primary PCI to proximal and distal RCA.  Patient underwent staged revascularization to severe proximal LAD stenosis on 10/07/2017.  Echocardiogram showed mildly reduced EF 45-50%.  Patient does have inferior T-wave inversions which were not present on admission, without any exertional chest pain.  These likely represent inferior infarct changes.  Patient was started on dual antiplatelet therapy with aspirin and Brilinta, in addition to lisinopril, metoprolol, and Crestor. Patient was given patient assistance program for Brilinta given his lack of insurance. Patient had leukocytosis throughout te hospital stay without any evidence of infection. This has improved on discharge.  Patient is scheduled to see me for follow-up on 10/20/2017.  Consults:  Case management  Significant Diagnostic Studies: Results for ALVIA, TORY (MRN [id number]) as of 10/08/2017 15:37  Ref. Range 10/06/2017 02:15 10/06/2017 02:29 10/06/2017 04:06 10/07/2017 08:47 10/08/2017 02:34  WBC Latest Ref Range: 4.0 - 10.5 K/uL 25.7 (H)  20.7 (H) 18.1 (H) 19.2 (H)  RBC Latest Ref Range: 4.22 - 5.81 MIL/uL 4.68  4.66 4.01 (L) 4.13 (L)  Hemoglobin Latest Ref Range: 13.0 - 17.0 g/dL 13.7 13.9 13.9 12.0 (L) 12.1 (L)  HCT Latest Ref Range: 39.0 - 52.0 % 43.0 41.0 41.1 35.6 (L) 36.6 (L)  MCV Latest Ref Range: 78.0 - 100.0 fL 91.9  88.2  88.8 88.6  MCH Latest Ref Range: 26.0 - 34.0 pg 29.3  29.8 29.9 29.3  MCHC Latest Ref Range: 30.0 - 36.0 g/dL 31.9  33.8 33.7 33.1  RDW Latest Ref Range: 11.5 - 15.5 % 14.6  14.3 14.6 14.4  Platelets Latest Ref Range: 150 - 400 K/uL 312  317 251 259   Results for NATALIO, SALOIS (MRN [id number]) as of 10/08/2017 15:37  Ref. Range 10/06/2017 02:15 10/06/2017 02:29 10/06/2017 04:06 10/07/2017 08:47 10/08/2017 02:34  WBC Latest Ref Range: 4.0 - 10.5 K/uL 25.7 (H)  20.7 (H) 18.1 (H) 19.2 (H)  RBC Latest Ref Range: 4.22 - 5.81 MIL/uL 4.68  4.66 4.01 (L) 4.13 (L)  Hemoglobin Latest Ref Range: 13.0 - 17.0 g/dL 13.7 13.9 13.9 12.0 (L) 12.1 (L)  HCT Latest Ref Range: 39.0 - 52.0 % 43.0 41.0 41.1 35.6 (L) 36.6 (L)  MCV Latest Ref Range: 78.0 - 100.0 fL 91.9  88.2 88.8 88.6  MCH Latest Ref Range: 26.0 - 34.0 pg 29.3  29.8 29.9 29.3  MCHC Latest Ref Range: 30.0 - 36.0 g/dL 31.9  33.8 33.7 33.1  RDW Latest Ref Range: 11.5 - 15.5 % 14.6  14.3 14.6 14.4  Platelets Latest Ref Range: 150 - 400 K/uL 312  317 251 [facility] echocardiogram 10/06/2017: Study Conclusions  - Left ventricle: The cavity size was normal. Wall thickness was   normal. Systolic function was mildly reduced. The estimated   ejection fraction was in the range of 45% to 50%. Mild   hypokinesis of the basalinferolateral myocardium. Left   ventricular diastolic function parameters were normal. - Right ventricle: Systolic function was low  normal. - Inferior vena cava: The vessel was dilated. The respirophasic   diameter changes were blunted (< 50%), consistent with elevated   central venous pressure. - No significant valvular abnormalities.  Treatments:    Discharge Exam: Blood pressure 111/73, pulse 83, temperature 98.4 F (36.9 C), temperature source Oral, resp. rate 18, height 5\' 5"  (1.651 m), weight 67.9 kg, SpO2 98 %.  Constitutional: He is oriented to person, place, and time. He appears well-developed and well-nourished.   HENT:  Head: Atraumatic.  Eyes: Pupils are equal, round, and reactive to light. Conjunctivae are normal.  Neck: No JVD present.  Cardiovascular: Normal rate, regular rhythm and normal heart sounds.  No murmur heard. Pulmonary/Chest: Effort normal and breath sounds normal. He has no wheezes. He exhibits no tenderness.  Abdominal: Soft. Bowel sounds are normal. There is no tenderness.  Musculoskeletal: Normal range of motion. He exhibits no edema.  Lymphadenopathy:    He has no cervical adenopathy.  Neurological: He is alert and oriented to person, place, and time.  Skin: Skin is warm and dry.  Psychiatric: He has a normal mood and affect.  Disposition: Discharge disposition: 01-Home or Self Care       Discharge Instructions    AMB Referral to Cardiac Rehabilitation - Phase II   Complete by:  As directed    Diagnosis:  STEMI   Amb Referral to Cardiac Rehabilitation   Complete by:  As directed    Diagnosis:   STEMI Coronary Stents     Diet - low sodium heart healthy   Complete by:  As directed    Increase activity slowly   Complete by:  As directed      Allergies as of 10/08/2017   No Known Allergies     Medication List    STOP taking these medications   gabapentin 300 MG capsule Commonly known as:  NEURONTIN   oxyCODONE-acetaminophen 5-325 MG tablet Commonly known as:  PERCOCET/ROXICET   predniSONE 10 MG (48) Tbpk tablet Commonly known as:  STERAPRED UNI-PAK 48 TAB     TAKE these medications   aspirin 81 MG chewable tablet Chew 1 tablet (81 mg total) by mouth daily. Start taking on:  10/09/2017 What changed:    how much to take  when to take this   lisinopril 5 MG tablet Commonly known as:  PRINIVIL,ZESTRIL Take 1 tablet (5 mg total) by mouth daily. Start taking on:  10/09/2017   metoprolol tartrate 25 MG tablet Commonly known as:  LOPRESSOR Take 0.5 tablets (12.5 mg total) by mouth 2 (two) times daily.   multivitamin with minerals tablet Take 1  tablet by mouth daily.   nitroGLYCERIN 0.4 MG SL tablet Commonly known as:  NITROSTAT Place 1 tablet (0.4 mg total) under the tongue every 5 (five) minutes as needed for chest pain.   nitroGLYCERIN 0.4 MG SL tablet Commonly known as:  NITROSTAT Place 1 tablet (0.4 mg total) under the tongue every 5 (five) minutes as needed for chest pain.   pantoprazole 20 MG tablet Commonly known as:  PROTONIX Take 1 tablet (20 mg total) by mouth daily.   rosuvastatin 20 MG tablet Commonly known as:  CRESTOR Take 1 tablet (20 mg total) by mouth daily at 6 PM.   ticagrelor 90 MG Tabs tablet Commonly known as:  BRILINTA Take 1 tablet (90 mg total) by mouth 2 (two) times daily.   ticagrelor 90 MG Tabs tablet Commonly known as:  BRILINTA Take 1 tablet (90 mg total)  by mouth 2 (two) times daily. Notes to patient:  This is a duplicate prescription Separate prescriptions provided so patient could make use of patient assistance program for Brilinta      Follow-up Information    Gweneth Fredlund, Reynold Bowen, MD Follow up on 10/20/2017.   Specialty:  Cardiology Why:  3:00 PM Contact information: [place] [address] [phone]        [place] Follow up on 10/15/2017.   Specialty:  Internal Medicine Why:  at 9:00 AM for hospital followup  Contact information: [facility] [place] Follow up.   Why:  Medication assistance; $4-$10 medications.  Contact information: [place] [address] [phone]          Signed: Nigel Mormon 10/08/2017, 2:50 PM  Nigel Mormon, MD [facility] Pager: [phone] Office: [phone] If no answer Cell [phone]

## 2017-10-08 NOTE — Progress Notes (Signed)
CARDIAC REHAB PHASE I   PRE:  Rate/Rhythm: 82 SR  BP:  Supine: 107/74  Sitting:   Standing:    SaO2: 96%RA  MODE:  Ambulation: 740 ft   POST:  Rate/Rhythm: 102 ST  BP:  Supine:   Sitting: 124/81  Standing:    SaO2: 97%RA 0900-0930 Pt walked 740 ft on RA with steady gait and no CP. Tolerated well. Stated felt good to be up. Gave ex ed and reinforced how to take NTG , importance of brilinta, heart healthy food choices . To recliner with call bell. Reinforced not smoking. Pt is pleased with how easy it has been here but knows it will be harder once home.   Graylon Good, RN BSN  10/08/2017 9:25 AM

## 2017-10-09 ENCOUNTER — Telehealth (HOSPITAL_COMMUNITY): Payer: Self-pay

## 2017-10-09 LAB — TROPONIN I: TROPONIN I: 29.4 ng/mL — AB (ref <0.03)

## 2017-10-09 MED FILL — ROSUVASTATIN CALCIUM 20 MG: 20 | 30 days supply | Qty: 30 | Fill #0

## 2017-10-09 MED FILL — PANTOPRAZOLE SOD DR 20 MG T: 20 | 30 days supply | Qty: 30 | Fill #0

## 2017-10-09 MED FILL — NITROSTAT 0.4 MG TABLET SL: 0.4 | 25 days supply | Qty: 25 | Fill #0

## 2017-10-09 MED FILL — LISINOPRIL 5 MG TAB: 5 | 30 days supply | Qty: 30 | Fill #0

## 2017-10-09 NOTE — Telephone Encounter (Signed)
Patient stated he is waiting to see if he is approve for Medicaid. And would like to wait to join the program to see if he is approve 1st.  I did adv pt of maintenance program.  Closed referral

## 2017-10-15 ENCOUNTER — Encounter: Payer: Self-pay | Admitting: Family Medicine

## 2017-10-15 ENCOUNTER — Ambulatory Visit (INDEPENDENT_AMBULATORY_CARE_PROVIDER_SITE_OTHER): Payer: Self-pay | Admitting: Family Medicine

## 2017-10-15 VITALS — BP 106/68 | HR 66 | Temp 98.4°F | Ht 65.0 in | Wt 147.0 lb

## 2017-10-15 DIAGNOSIS — R238 Other skin changes: Secondary | ICD-10-CM

## 2017-10-15 DIAGNOSIS — I252 Old myocardial infarction: Secondary | ICD-10-CM

## 2017-10-15 DIAGNOSIS — Z1211 Encounter for screening for malignant neoplasm of colon: Secondary | ICD-10-CM

## 2017-10-15 DIAGNOSIS — M25512 Pain in left shoulder: Secondary | ICD-10-CM

## 2017-10-15 DIAGNOSIS — Z09 Encounter for follow-up examination after completed treatment for conditions other than malignant neoplasm: Secondary | ICD-10-CM

## 2017-10-15 DIAGNOSIS — Z7689 Persons encountering health services in other specified circumstances: Secondary | ICD-10-CM

## 2017-10-15 LAB — POCT URINALYSIS DIP (MANUAL ENTRY)
Bilirubin, UA: NEGATIVE
Blood, UA: NEGATIVE
Glucose, UA: NEGATIVE mg/dL
Ketones, POC UA: NEGATIVE mg/dL
Leukocytes, UA: NEGATIVE
Nitrite, UA: NEGATIVE
Protein Ur, POC: NEGATIVE mg/dL
Spec Grav, UA: 1.02 (ref 1.010–1.025)
Urobilinogen, UA: 0.2 E.U./dL
pH, UA: 7 (ref 5.0–8.0)

## 2017-10-15 NOTE — Progress Notes (Signed)
New Patient---Establish Care  Subjective:    Patient ID: Adam [name], male    DOB: 02-05-1965, 52 y.o.   MRN: [id number]   Chief Complaint  Patient presents with  . Establish Care  . Hospitalization Follow-up    HPI  Adam [name] is a 52 year old male with a past medical history of MI, Hypertension, Hx of Kidney Stones, Head Injury, Carpal Tunnel Syndrome, and Asthma. He is here today to establish care.   Current Status: Since his last office visit, he is doing well with no complaints today, he is s/p: MI on 09/2017. Prior to MI, he had a left shoulder injury for about 3 weeks, and he thought he was just having shoulder pain instead of a heart attack. His shoulder pain has not gotten better. He denies visual changes, chest pain, heart palpitations, and falls. He has occasionally headaches and dizziness with position changes. Denies severe headaches, confusion, seizures, double vision, and blurred vision, nausea and vomiting. He reports soreness in his scalp and skin changes on the right lateral aspect of his nose.   He denies fevers, chills, fatigue, recent infections, weight loss, and night sweats. He has not had any falls. No reports of GI problems such as diarrhea, and constipation. She has no reports of blood in stools, dysuria and hematuria. No depression or anxiety reported.   Review of Systems  Respiratory: Negative.   Cardiovascular: Negative.   Gastrointestinal: Negative.   Genitourinary: Negative.   Musculoskeletal: Positive for arthralgias (left shoulder pain. ).  Psychiatric/Behavioral: Negative.    Objective:   Physical Exam  Neck: Normal range of motion. Neck supple.  Cardiovascular: Regular rhythm, normal heart sounds and intact distal pulses.  Pulmonary/Chest: Effort normal and breath sounds normal.  Abdominal: Soft. Bowel sounds are normal.  Musculoskeletal:  Left shoulder pain.   Skin: Skin is warm and dry.  Psychiatric: He has a normal mood and affect. His  behavior is normal. Judgment and thought content normal.   Assessment & Plan:   1. Hospital discharge follow-up  2. Encounter to establish care - POCT urinalysis dipstick - Lipid Panel - CBC with Differential - Comprehensive metabolic panel; Future - PSA - TSH  3. Status post myocardial infarction Stable. He will continue Brilinta as prescribed.   4. Other skin changes - Ambulatory referral to Dermatology  5. Left shoulder pain, unspecified chronicity Continue to take OTC pain medications. We will continue to monitor.  6. Screening for colon cancer - Ambulatory referral to Gastroenterology  7. Follow up He will follow up in 3 months.   No orders of the defined types were placed in this encounter.   Kathe Becton,  MSN, FNP-C Patient [address] [place], [place] [address] [phone]

## 2017-10-16 LAB — CBC WITH DIFFERENTIAL/PLATELET
Basophils Absolute: 0.1 10*3/uL (ref 0.0–0.2)
Basos: 1 %
EOS (ABSOLUTE): 0.4 10*3/uL (ref 0.0–0.4)
Eos: 3 %
Hematocrit: 41.5 % (ref 37.5–51.0)
Hemoglobin: 13.3 g/dL (ref 13.0–17.7)
Immature Grans (Abs): 0.1 10*3/uL (ref 0.0–0.1)
Immature Granulocytes: 1 %
Lymphocytes Absolute: 1.7 10*3/uL (ref 0.7–3.1)
Lymphs: 13 %
MCH: 28.4 pg (ref 26.6–33.0)
MCHC: 32 g/dL (ref 31.5–35.7)
MCV: 89 fL (ref 79–97)
Monocytes Absolute: 1.3 10*3/uL — ABNORMAL HIGH (ref 0.1–0.9)
Monocytes: 10 %
Neutrophils Absolute: 9.1 10*3/uL — ABNORMAL HIGH (ref 1.4–7.0)
Neutrophils: 72 %
Platelets: 527 10*3/uL — ABNORMAL HIGH (ref 150–450)
RBC: 4.68 x10E6/uL (ref 4.14–5.80)
RDW: 13 % (ref 12.3–15.4)
WBC: 12.6 10*3/uL — ABNORMAL HIGH (ref 3.4–10.8)

## 2017-10-16 LAB — LIPID PANEL
Chol/HDL Ratio: 2.5 ratio (ref 0.0–5.0)
Cholesterol, Total: 127 mg/dL (ref 100–199)
HDL: 50 mg/dL (ref >39)
LDL Calculated: 63 mg/dL (ref 0–99)
Triglycerides: 71 mg/dL (ref 0–149)
VLDL Cholesterol Cal: 14 mg/dL (ref 5–40)

## 2017-10-16 LAB — PSA: Prostate Specific Ag, Serum: 0.6 ng/mL (ref 0.0–4.0)

## 2017-10-16 LAB — TSH: TSH: 2.62 u[IU]/mL (ref 0.450–4.500)

## 2017-10-21 ENCOUNTER — Telehealth: Payer: Self-pay

## 2017-10-21 NOTE — Telephone Encounter (Signed)
-----   Message from Azzie Glatter, [place] sent at 10/20/2017  9:53 PM EDT ----- Regarding: "Lab Results" Adam [name],  Please inform patient that all labs are stable.  Please assess patient's left shoulder pain. Improved? Worsened? We may possibly get should X-ray if pain does not improve.    Thanks [employer].

## 2017-10-21 NOTE — Telephone Encounter (Signed)
Patient states that his shoulder hasn't got any better.

## 2017-10-22 ENCOUNTER — Telehealth: Payer: Self-pay | Admitting: Family Medicine

## 2017-10-22 ENCOUNTER — Other Ambulatory Visit: Payer: Self-pay | Admitting: Family Medicine

## 2017-10-22 DIAGNOSIS — M25512 Pain in left shoulder: Secondary | ICD-10-CM

## 2017-10-22 NOTE — Telephone Encounter (Signed)
-----   Message from Azzie Glatter, [place] sent at 10/22/2017 10:52 AM EDT ----- Regarding: "Scan" Adam [name],   Please inform patient that order is in for him to get scan of left shoulder. Could you please get approval?  Will they contact patient?  Thanks!

## 2017-10-22 NOTE — Telephone Encounter (Signed)
Spoke to patient concerning increased left shoulder pain. Order in for left shoulder scan as soon as possible for further evaluation.

## 2017-10-22 NOTE — Telephone Encounter (Signed)
Patient notified to go and get imaging done tomorrow. No approval is need for xray

## 2017-10-23 ENCOUNTER — Ambulatory Visit (HOSPITAL_COMMUNITY)
Admission: RE | Admit: 2017-10-23 | Discharge: 2017-10-23 | Disposition: A | Discharge status: Home / Self Care | Payer: Self-pay | Source: Ambulatory Visit | Attending: Family Medicine | Admitting: Family Medicine

## 2017-10-23 DIAGNOSIS — M19012 Primary osteoarthritis, left shoulder: Secondary | ICD-10-CM | POA: Insufficient documentation

## 2017-10-23 DIAGNOSIS — M25512 Pain in left shoulder: Secondary | ICD-10-CM | POA: Insufficient documentation

## 2017-10-23 NOTE — Progress Notes (Signed)
Radiology called and needed updated xray order for the left shoulder. Placed order for patient.

## 2017-10-30 ENCOUNTER — Encounter: Payer: Self-pay | Admitting: Physician Assistant

## 2017-10-30 MED FILL — BRILINTA 90 MG TABLET: 90 | 30 days supply | Qty: 60 | Fill #0

## 2017-11-03 MED FILL — METOPROLOL TARTRATE 25 MG T: 25 | 30 days supply | Qty: 30 | Fill #0

## 2017-11-04 MED FILL — ROSUVASTATIN CALCIUM 20 MG: 20 | 30 days supply | Qty: 30 | Fill #1

## 2017-11-05 MED FILL — LISINOPRIL 5 MG TAB: 5 | 30 days supply | Qty: 30 | Fill #1

## 2017-11-25 ENCOUNTER — Encounter: Payer: Self-pay | Admitting: Family Medicine

## 2017-11-25 ENCOUNTER — Ambulatory Visit (INDEPENDENT_AMBULATORY_CARE_PROVIDER_SITE_OTHER): Payer: Self-pay | Admitting: Family Medicine

## 2017-11-25 VITALS — BP 130/82 | HR 66 | Temp 97.8°F | Ht 65.0 in | Wt 150.8 lb

## 2017-11-25 DIAGNOSIS — F419 Anxiety disorder, unspecified: Secondary | ICD-10-CM

## 2017-11-25 DIAGNOSIS — Z09 Encounter for follow-up examination after completed treatment for conditions other than malignant neoplasm: Secondary | ICD-10-CM

## 2017-11-25 DIAGNOSIS — Z720 Tobacco use: Secondary | ICD-10-CM

## 2017-11-25 DIAGNOSIS — M25512 Pain in left shoulder: Secondary | ICD-10-CM

## 2017-11-25 DIAGNOSIS — Z716 Tobacco abuse counseling: Secondary | ICD-10-CM

## 2017-11-25 DIAGNOSIS — N529 Male erectile dysfunction, unspecified: Secondary | ICD-10-CM

## 2017-11-25 DIAGNOSIS — I252 Old myocardial infarction: Secondary | ICD-10-CM

## 2017-11-25 MED ORDER — ALPRAZOLAM 0.25 MG PO TABS: 0.2500 mg | ORAL_TABLET | Freq: Two times a day (BID) | ORAL | 0 refills | Status: DC | PRN

## 2017-11-25 MED ORDER — TRAMADOL HCL 50 MG PO TABS: 50.0000 mg | ORAL_TABLET | Freq: Three times a day (TID) | ORAL | 0 refills | Status: DC | PRN

## 2017-11-25 MED ORDER — SILDENAFIL CITRATE 100 MG PO TABS: 50.0000 mg | ORAL_TABLET | Freq: Every day | ORAL | 11 refills | Status: DC | PRN

## 2017-11-25 MED ORDER — BUSPIRONE HCL 5 MG PO TABS: 5.0000 mg | ORAL_TABLET | Freq: Two times a day (BID) | ORAL | 1 refills | Status: DC

## 2017-11-25 MED FILL — busPIRone HCL 5 MG TABS: 5 | 30 days supply | Qty: 60 | Fill #0

## 2017-11-25 MED FILL — traMADol HCL 50 MG TABS: 50 | 10 days supply | Qty: 30 | Fill #0

## 2017-11-25 MED FILL — SILDENAFIL CITRATE 100 MG T: 100 | 30 days supply | Qty: 10 | Fill #0

## 2017-11-25 NOTE — Patient Instructions (Addendum)
Buspirone tablets What is this medicine? BUSPIRONE (byoo SPYE rone) is used to treat anxiety disorders. This medicine may be used for other purposes; ask your health care provider or pharmacist if you have questions. COMMON BRAND NAME(S): BuSpar What should I tell my health care provider before I take this medicine? They need to know if you have any of these conditions: -kidney or liver disease -an unusual or allergic reaction to buspirone, other medicines, foods, dyes, or preservatives -pregnant or trying to get pregnant -breast-feeding How should I use this medicine? Take this medicine by mouth with a glass of water. Follow the directions on the prescription label. You may take this medicine with or without food. To ensure that this medicine always works the same way for you, you should take it either always with or always without food. Take your doses at regular intervals. Do not take your medicine more often than directed. Do not stop taking except on the advice of your doctor or health care professional. Talk to your pediatrician regarding the use of this medicine in children. Special care may be needed. Overdosage: If you think you have taken too much of this medicine contact a poison control center or emergency room at once. NOTE: This medicine is only for you. Do not share this medicine with others. What if I miss a dose? If you miss a dose, take it as soon as you can. If it is almost time for your next dose, take only that dose. Do not take double or extra doses. What may interact with this medicine? Do not take this medicine with any of the following medications: -linezolid -MAOIs like Carbex, Eldepryl, Marplan, Nardil, and Parnate -methylene blue -procarbazine This medicine may also interact with the following medications: -diazepam -digoxin -diltiazem -erythromycin -grapefruit juice -haloperidol -medicines for mental depression or mood problems -medicines for seizures like  carbamazepine, phenobarbital and phenytoin -nefazodone -other medications for anxiety -rifampin -ritonavir -some antifungal medicines like itraconazole, ketoconazole, and voriconazole -verapamil -warfarin This list may not describe all possible interactions. Give your health care provider a list of all the medicines, herbs, non-prescription drugs, or dietary supplements you use. Also tell them if you smoke, drink alcohol, or use illegal drugs. Some items may interact with your medicine. What should I watch for while using this medicine? Visit your doctor or health care professional for regular checks on your progress. It may take 1 to 2 weeks before your anxiety gets better. You may get drowsy or dizzy. Do not drive, use machinery, or do anything that needs mental alertness until you know how this drug affects you. Do not stand or sit up quickly, especially if you are an older patient. This reduces the risk of dizzy or fainting spells. Alcohol can make you more drowsy and dizzy. Avoid alcoholic drinks. What side effects may I notice from receiving this medicine? Side effects that you should report to your doctor or health care professional as soon as possible: -blurred vision or other vision changes -chest pain -confusion -difficulty breathing -feelings of hostility or anger -muscle aches and pains -numbness or tingling in hands or feet -ringing in the ears -skin rash and itching -vomiting -weakness Side effects that usually do not require medical attention (report to your doctor or health care professional if they continue or are bothersome): -disturbed dreams, nightmares -headache -nausea -restlessness or nervousness -sore throat and nasal congestion -stomach upset This list may not describe all possible side effects. Call your doctor for medical advice about side   effects. You may report side effects to FDA at 1-800-FDA-1088. Where should I keep my medicine? Keep out of the reach  of children. Store at room temperature below 30 degrees C (86 degrees F). Protect from light. Keep container tightly closed. Throw away any unused medicine after the expiration date. NOTE: This sheet is a summary. It may not cover all possible information. If you have questions about this medicine, talk to your doctor, pharmacist, or health care provider.  2018 [employer] Standard (2009-08-24 18:06:11)      Alprazolam tablets What is this medicine? ALPRAZOLAM (al PRAY zoe lam) is a benzodiazepine. It is used to treat anxiety and panic attacks. This medicine may be used for other purposes; ask your health care provider or pharmacist if you have questions. COMMON BRAND NAME(S): Xanax What should I tell my health care provider before I take this medicine? They need to know if you have any of these conditions: -an alcohol or drug abuse problem -bipolar disorder, depression, psychosis or other mental health conditions -glaucoma -kidney or liver disease -lung or breathing disease -myasthenia gravis -Parkinson's disease -porphyria -seizures or a history of seizures -suicidal thoughts -an unusual or allergic reaction to alprazolam, other benzodiazepines, foods, dyes, or preservatives -pregnant or trying to get pregnant -breast-feeding How should I use this medicine? Take this medicine by mouth with a glass of water. Follow the directions on the prescription label. Take your medicine at regular intervals. Do not take it more often than directed. Do not stop taking except on your doctor's advice. A special MedGuide will be given to you by the pharmacist with each prescription and refill. Be sure to read this information carefully each time. Talk to your pediatrician regarding the use of this medicine in children. Special care may be needed. Overdosage: If you think you have taken too much of this medicine contact a poison control center or emergency room at once. NOTE: This medicine is  only for you. Do not share this medicine with others. What if I miss a dose? If you miss a dose, take it as soon as you can. If it is almost time for your next dose, take only that dose. Do not take double or extra doses. What may interact with this medicine? Do not take this medicine with any of the following medications: -certain antiviral medicines for HIV or AIDS like delavirdine, indinavir -certain medicines for fungal infections like ketoconazole and itraconazole -narcotic medicines for cough -sodium oxybate This medicine may also interact with the following medications: -alcohol -antihistamines for allergy, cough and cold -certain antibiotics like clarithromycin, erythromycin, isoniazid, rifampin, rifapentine, rifabutin, and troleandomycin -certain medicines for blood pressure, heart disease, irregular heart beat -certain medicines for depression, like amitriptyline, fluoxetine, sertraline -certain medicines for seizures like carbamazepine, oxcarbazepine, phenobarbital, phenytoin, primidone -cimetidine -cyclosporine -male hormones, like estrogens or progestins and birth control pills, patches, rings, or injections -general anesthetics like halothane, isoflurane, methoxyflurane, propofol -grapefruit juice -local anesthetics like lidocaine, pramoxine, tetracaine -medicines that relax muscles for surgery -narcotic medicines for pain -other antiviral medicines for HIV or AIDS -phenothiazines like chlorpromazine, mesoridazine, prochlorperazine, thioridazine This list may not describe all possible interactions. Give your health care provider a list of all the medicines, herbs, non-prescription drugs, or dietary supplements you use. Also tell them if you smoke, drink alcohol, or use illegal drugs. Some items may interact with your medicine. What should I watch for while using this medicine? Tell your doctor or health care professional if your symptoms do not start  to get better or if  they get worse. Do not stop taking except on your doctor's advice. You may develop a severe reaction. Your doctor will tell you how much medicine to take. You may get drowsy or dizzy. Do not drive, use machinery, or do anything that needs mental alertness until you know how this medicine affects you. To reduce the risk of dizzy and fainting spells, do not stand or sit up quickly, especially if you are an older patient. Alcohol may increase dizziness and drowsiness. Avoid alcoholic drinks. If you are taking another medicine that also causes drowsiness, you may have more side effects. Give your health care provider a list of all medicines you use. Your doctor will tell you how much medicine to take. Do not take more medicine than directed. Call emergency for help if you have problems breathing or unusual sleepiness. What side effects may I notice from receiving this medicine? Side effects that you should report to your doctor or health care professional as soon as possible: -allergic reactions like skin rash, itching or hives, swelling of the face, lips, or tongue -breathing problems -confusion -loss of balance or coordination -signs and symptoms of low blood pressure like dizziness; feeling faint or lightheaded, falls; unusually weak or tired -suicidal thoughts or other mood changes Side effects that usually do not require medical attention (report to your doctor or health care professional if they continue or are bothersome): -dizziness -dry mouth -nausea, vomiting -tiredness This list may not describe all possible side effects. Call your doctor for medical advice about side effects. You may report side effects to FDA at 1-800-FDA-1088. Where should I keep my medicine? Keep out of the reach of children. This medicine can be abused. Keep your medicine in a safe place to protect it from theft. Do not share this medicine with anyone. Selling or giving away this medicine is dangerous and against the  law. Store at room temperature between 20 and 25 degrees C (68 and 77 degrees F). This medicine may cause accidental overdose and death if taken by other adults, children, or pets. Mix any unused medicine with a substance like cat litter or coffee grounds. Then throw the medicine away in a sealed container like a sealed bag or a coffee can with a lid. Do not use the medicine after the expiration date. NOTE: This sheet is a summary. It may not cover all possible information. If you have questions about this medicine, talk to your doctor, pharmacist, or health care provider.  2018 [employer] Standard (2014-10-13 13:47:25)    Tramadol tablets What is this medicine? TRAMADOL (TRA ma dole) is a pain reliever. It is used to treat moderate to severe pain in adults. This medicine may be used for other purposes; ask your health care provider or pharmacist if you have questions. COMMON BRAND NAME(S): Ultram What should I tell my health care provider before I take this medicine? They need to know if you have any of these conditions: -brain tumor -depression -drug abuse or addiction -head injury -if you frequently drink alcohol containing drinks -kidney disease or trouble passing urine -liver disease -lung disease, asthma, or breathing problems -seizures or epilepsy -suicidal thoughts, plans, or attempt; a previous suicide attempt by you or a family member -an unusual or allergic reaction to tramadol, codeine, other medicines, foods, dyes, or preservatives -pregnant or trying to get pregnant -breast-feeding How should I use this medicine? Take this medicine by mouth with a full glass of water. Follow the  directions on the prescription label. You can take it with or without food. If it upsets your stomach, take it with food. Do not take your medicine more often than directed. A special MedGuide will be given to you by the pharmacist with each prescription and refill. Be sure to read this  information carefully each time. Talk to your pediatrician regarding the use of this medicine in children. Special care may be needed. Overdosage: If you think you have taken too much of this medicine contact a poison control center or emergency room at once. NOTE: This medicine is only for you. Do not share this medicine with others. What if I miss a dose? If you miss a dose, take it as soon as you can. If it is almost time for your next dose, take only that dose. Do not take double or extra doses. What may interact with this medicine? Do not take this medication with any of the following medicines: -MAOIs like Carbex, Eldepryl, Marplan, Nardil, and Parnate This medicine may also interact with the following medications: -alcohol -antihistamines for allergy, cough and cold -certain medicines for anxiety or sleep -certain medicines for depression like amitriptyline, fluoxetine, sertraline -certain medicines for migraine headache like almotriptan, eletriptan, frovatriptan, naratriptan, rizatriptan, sumatriptan, zolmitriptan -certain medicines for seizures like carbamazepine, oxcarbazepine, phenobarbital, primidone -certain medicines that treat or prevent blood clots like warfarin -digoxin -furazolidone -general anesthetics like halothane, isoflurane, methoxyflurane, propofol -linezolid -local anesthetics like lidocaine, pramoxine, tetracaine -medicines that relax muscles for surgery -other narcotic medicines for pain or cough -phenothiazines like chlorpromazine, mesoridazine, prochlorperazine, thioridazine -procarbazine This list may not describe all possible interactions. Give your health care provider a list of all the medicines, herbs, non-prescription drugs, or dietary supplements you use. Also tell them if you smoke, drink alcohol, or use illegal drugs. Some items may interact with your medicine. What should I watch for while using this medicine? Tell your doctor or health care  professional if your pain does not go away, if it gets worse, or if you have new or a different type of pain. You may develop tolerance to the medicine. Tolerance means that you will need a higher dose of the medicine for pain relief. Tolerance is normal and is expected if you take this medicine for a long time. Do not suddenly stop taking your medicine because you may develop a severe reaction. Your body becomes used to the medicine. This does NOT mean you are addicted. Addiction is a behavior related to getting and using a drug for a non-medical reason. If you have pain, you have a medical reason to take pain medicine. Your doctor will tell you how much medicine to take. If your doctor wants you to stop the medicine, the dose will be slowly lowered over time to avoid any side effects. There are different types of narcotic medicines (opiates). If you take more than one type at the same time or if you are taking another medicine that also causes drowsiness, you may have more side effects. Give your health care provider a list of all medicines you use. Your doctor will tell you how much medicine to take. Do not take more medicine than directed. Call emergency for help if you have problems breathing or unusual sleepiness. You may get drowsy or dizzy. Do not drive, use machinery, or do anything that needs mental alertness until you know how this medicine affects you. Do not stand or sit up quickly, especially if you are an older patient. This reduces  the risk of dizzy or fainting spells. Alcohol can increase or decrease the effects of this medicine. Avoid alcoholic drinks. You may have constipation. Try to have a bowel movement at least every 2 to 3 days. If you do not have a bowel movement for 3 days, call your doctor or health care professional. Your mouth may get dry. Chewing sugarless gum or sucking hard candy, and drinking plenty of water may help. Contact your doctor if the problem does not go away or is  severe. What side effects may I notice from receiving this medicine? Side effects that you should report to your doctor or health care professional as soon as possible: -allergic reactions like skin rash, itching or hives, swelling of the face, lips, or tongue -breathing problems -confusion -seizures -signs and symptoms of low blood pressure like dizziness; feeling faint or lightheaded, falls; unusually weak or tired -trouble passing urine or change in the amount of urine Side effects that usually do not require medical attention (report to your doctor or health care professional if they continue or are bothersome): -constipation -dry mouth -nausea, vomiting -tiredness This list may not describe all possible side effects. Call your doctor for medical advice about side effects. You may report side effects to FDA at 1-800-FDA-1088. Where should I keep my medicine? Keep out of the reach of children. This medicine may cause accidental overdose and death if it taken by other adults, children, or pets. Mix any unused medicine with a substance like cat litter or coffee grounds. Then throw the medicine away in a sealed container like a sealed bag or a coffee can with a lid. Do not use the medicine after the expiration date. Store at room temperature between 15 and 30 degrees C (59 and 86 degrees F). NOTE: This sheet is a summary. It may not cover all possible information. If you have questions about this medicine, talk to your doctor, pharmacist, or health care provider.  2018 [employer] Standard (2014-10-09 09:00:04)       Sildenafil tablets (Viagra) What is this medicine? SILDENAFIL (sil DEN a fil) is used to treat erection problems in men. This medicine may be used for other purposes; ask your health care provider or pharmacist if you have questions. COMMON BRAND NAME(S): Viagra What should I tell my health care provider before I take this medicine? They need to know if you have any  of these conditions: -bleeding disorders -eye or vision problems, including a rare inherited eye disease called retinitis pigmentosa -anatomical deformation of the penis, Peyronie's disease, or history of priapism (painful and prolonged erection) -heart disease, angina, a history of heart attack, irregular heart beats, or other heart problems -high or low blood pressure -history of blood diseases, like sickle cell anemia or leukemia -history of stomach bleeding -kidney disease -liver disease -stroke -an unusual or allergic reaction to sildenafil, other medicines, foods, dyes, or preservatives -pregnant or trying to get pregnant -breast-feeding How should I use this medicine? Take this medicine by mouth with a glass of water. Follow the directions on the prescription label. The dose is usually taken 1 hour before sexual activity. You should not take the dose more than once per day. Do not take your medicine more often than directed. Talk to your pediatrician regarding the use of this medicine in children. This medicine is not used in children for this condition. Overdosage: If you think you have taken too much of this medicine contact a poison control center or emergency room at once.  NOTE: This medicine is only for you. Do not share this medicine with others. What if I miss a dose? This does not apply. Do not take double or extra doses. What may interact with this medicine? Do not take this medicine with any of the following medications: -cisapride -nitrates like amyl nitrite, isosorbide dinitrate, isosorbide mononitrate, nitroglycerin -riociguat This medicine may also interact with the following medications: -antiviral medicines for HIV or AIDS -bosentan -certain medicines for benign prostatic hyperplasia (BPH) -certain medicines for blood pressure -certain medicines for fungal infections like ketoconazole and itraconazole -cimetidine -erythromycin -rifampin This list may not  describe all possible interactions. Give your health care provider a list of all the medicines, herbs, non-prescription drugs, or dietary supplements you use. Also tell them if you smoke, drink alcohol, or use illegal drugs. Some items may interact with your medicine. What should I watch for while using this medicine? If you notice any changes in your vision while taking this drug, call your doctor or health care professional as soon as possible. Stop using this medicine and call your health care provider right away if you have a loss of sight in one or both eyes. Contact your doctor or health care professional right away if you have an erection that lasts longer than 4 hours or if it becomes painful. This may be a sign of a serious problem and must be treated right away to prevent permanent damage. If you experience symptoms of nausea, dizziness, chest pain or arm pain upon initiation of sexual activity after taking this medicine, you should refrain from further activity and call your doctor or health care professional as soon as possible. Do not drink alcohol to excess (examples, 5 glasses of wine or 5 shots of whiskey) when taking this medicine. When taken in excess, alcohol can increase your chances of getting a headache or getting dizzy, increasing your heart rate or lowering your blood pressure. Using this medicine does not protect you or your partner against HIV infection (the virus that causes AIDS) or other sexually transmitted diseases. What side effects may I notice from receiving this medicine? Side effects that you should report to your doctor or health care professional as soon as possible: -allergic reactions like skin rash, itching or hives, swelling of the face, lips, or tongue -breathing problems -changes in hearing -changes in vision -chest pain -fast, irregular heartbeat -prolonged or painful erection -seizures Side effects that usually do not require medical attention (report to  your doctor or health care professional if they continue or are bothersome): -back pain -dizziness -flushing -headache -indigestion -muscle aches -nausea -stuffy or runny nose This list may not describe all possible side effects. Call your doctor for medical advice about side effects. You may report side effects to FDA at 1-800-FDA-1088. Where should I keep my medicine? Keep out of reach of children. Store at room temperature between 15 and 30 degrees C (59 and 86 degrees F). Throw away any unused medicine after the expiration date. NOTE: This sheet is a summary. It may not cover all possible information. If you have questions about this medicine, talk to your doctor, pharmacist, or health care provider.  2018 [employer] (2014-12-28 12:00:25)

## 2017-11-25 NOTE — Progress Notes (Signed)
Sick Visit  Subjective:    Patient ID: Adam [name], male    DOB: 03-03-1965, 52 y.o.   MRN: [id number]   Chief Complaint  Patient presents with  . Anxiety   HPI  Adam [name] is a 52 year old male with a past medical history of MI, Hypertension, History of Kidney Stones, Head Injury, Carpal Tunnel Syndrome, Anxiety, and Asthma. He is here today for a Sick Visit.   Current Status: Since his last office visit, he states that his anxiety has been elevated since 9/9/209 after his MI. He frequently takes long walks, up to 10 miles daily when anxiety is very high, but continues to have problems sleeping during those days. He is ready to get back into his regular routine of working. He denies visual changes, chest pain, cough, shortness of breath, heart palpitations, and falls. He has occasionally headaches and dizziness with position changes. Denies severe headaches, confusion, seizures, double vision, and blurred vision, nausea and vomiting.He continues to have left shoulder pain, from a previous injury. He is currently taking Motrin, Acetaminophen, and other pain medications to help reduce pain in shoulder.   He denies fevers, chills, fatigue, recent infections, weight loss, and night sweats. No reports of GI problems such as nausea, vomiting, diarrhea, and constipation. He has no reports of blood in stools, dysuria and hematuria.  Past Medical History:  Diagnosis Date  . Anxiety   . Asthma   . Carpal tunnel syndrome on both sides   . Head injury, acute, with loss of consciousness ([place])    hit on head with bucket at work- bucket fell.  [name] History of kidney stones   . Hypertension    no- was up a couple times whene he went to Dr , told he doesnt have HTN  . Myocardial infarction [facility])     Family History  Problem Relation Age of Onset  . Hypertension Father   . Cancer Father   . Heart failure Father     Social History   Socioeconomic History  . Marital status: Married    Spouse  name: Not on file  . Number of children: Not on file  . Years of education: Not on file  . Highest education level: Not on file  Occupational History  . Not on file  Social Needs  . Financial resource strain: Somewhat hard  . Food insecurity:    Worry: Sometimes true    Inability: Sometimes true  . Transportation needs:    Medical: No    Non-medical: No  Tobacco Use  . Smoking status: Current Every Day Smoker    Packs/day: 1.00    Years: 25.00    Pack years: 25.00    Types: Cigarettes  . Smokeless tobacco: Current User  Substance and Sexual Activity  . Alcohol use: Yes    Comment: maybe 6 beers a month  . Drug use: No  . Sexual activity: Not on file  Lifestyle  . Physical activity:    Days per week: 4 days    Minutes per session: Not on file  . Stress: Only a little  Relationships  . Social connections:    Talks on phone: More than three times a week    Gets together: More than three times a week    Attends religious service: More than 4 times per year    Active member of club or organization: Not on file    Attends meetings of clubs or organizations: More than 4 times  per year    Relationship status: Never married  . Intimate partner violence:    Fear of current or ex partner: No    Emotionally abused: No    Physically abused: No    Forced sexual activity: No  Other Topics Concern  . Not on file  Social History Narrative  . Not on file    Immunization History  Administered Date(s) Administered  . Tdap 09/15/2012    Current Meds  Medication Sig  . aspirin 81 MG chewable tablet Chew 1 tablet (81 mg total) by mouth daily.  . calcium-vitamin D (OSCAL WITH D) 500-200 MG-UNIT tablet Take 1 tablet by mouth.  [name] lisinopril (PRINIVIL,ZESTRIL) 5 MG tablet Take 1 tablet (5 mg total) by mouth daily.  . metoprolol tartrate (LOPRESSOR) 25 MG tablet Take 0.5 tablets (12.5 mg total) by mouth 2 (two) times daily.  . Multiple Vitamins-Minerals (MULTIVITAMIN WITH MINERALS)  tablet Take 1 tablet by mouth daily.  . nitroGLYCERIN (NITROSTAT) 0.4 MG SL tablet Place 1 tablet (0.4 mg total) under the tongue every 5 (five) minutes as needed for chest pain.  . nitroGLYCERIN (NITROSTAT) 0.4 MG SL tablet Place 1 tablet (0.4 mg total) under the tongue every 5 (five) minutes as needed for chest pain.  . rosuvastatin (CRESTOR) 20 MG tablet Take 1 tablet (20 mg total) by mouth daily at 6 PM.  . ticagrelor (BRILINTA) 90 MG TABS tablet Take 1 tablet (90 mg total) by mouth 2 (two) times daily.    No Known Allergies  BP 130/82 (BP Location: Left Arm, Patient Position: Sitting, Cuff Size: Small)   Pulse 66   Temp 97.8 F (36.6 C) (Oral)   Ht 5\' 5"  (1.651 m)   Wt 150 lb 12.8 oz (68.4 kg)   SpO2 99%   BMI 25.09 kg/m   Review of Systems  Constitutional: Positive for fatigue (Mild).  HENT: Negative.   Respiratory: Positive for shortness of breath (Occasional).   Cardiovascular: Negative.   Gastrointestinal: Negative.   Endocrine: Negative.   Genitourinary: Negative.   Musculoskeletal: Negative.   Skin: Negative.   Neurological: Positive for dizziness (Occasional) and headaches (Occasional).  Psychiatric/Behavioral: Positive for sleep disturbance (Insomnia). The patient is nervous/anxious (Increaased).    Objective:   Physical Exam  Constitutional: He is oriented to person, place, and time. He appears well-developed and well-nourished.  HENT:  Head: Normocephalic and atraumatic.  Neck: Normal range of motion. Neck supple.  Cardiovascular: Normal rate, regular rhythm, normal heart sounds and intact distal pulses.  Pulmonary/Chest: Effort normal and breath sounds normal.  Abdominal: Soft. Bowel sounds are normal.  Musculoskeletal: Normal range of motion.  Neurological: He is alert and oriented to person, place, and time.  Skin: Skin is warm and dry.  Psychiatric: Judgment and thought content normal.  Increased anxiety  Nursing note and vitals reviewed.  Assessment  & Plan:   1. Anxiety We will initiate Buspar today. We will also initiate Xanax on a 30-day trial basis for acute anxiety attack. He will follow up in 6 weeks. Monitor.  - busPIRone (BUSPAR) 5 MG tablet; Take 1 tablet (5 mg total) by mouth 2 (two) times daily.  Dispense: 60 tablet; Refill: 1 - ALPRAZolam (XANAX) 0.25 MG tablet; Take 1 tablet (0.25 mg total) by mouth 2 (two) times daily as needed for anxiety.  Dispense: 30 tablet; Refill: 0  2. Status post myocardial infarction Stable. He continues to follow up with Cardiologist as needed. Monitor.  3. Left shoulder pain, unspecified chronicity  We will initiate Tramadol today. Monitor.  - traMADol (ULTRAM) 50 MG tablet; Take 1 tablet (50 mg total) by mouth every 8 (eight) hours as needed.  Dispense: 30 tablet; Refill: 0  4. Erectile dysfunction, unspecified erectile dysfunction type We will initiate Sildenafil today. He is prescribed Nitrostat as needed for chest pain. Patient is counseled on Nitrate precautions. We will continue to monitor.  - sildenafil (VIAGRA) 100 MG tablet; Take 0.5-1 tablets (50-100 mg total) by mouth daily as needed for erectile dysfunction.  Dispense: 5 tablet; Refill: 11  5. Tobacco abuse He continues to minimally.   6. Encounter for smoking cessation counseling He will call office when he is ready to quit smoking.   7. Follow up He will follow up in 6 months.   Current Meds  Medication Sig  . aspirin 81 MG chewable tablet Chew 1 tablet (81 mg total) by mouth daily.  . calcium-vitamin D (OSCAL WITH D) 500-200 MG-UNIT tablet Take 1 tablet by mouth.  [name] lisinopril (PRINIVIL,ZESTRIL) 5 MG tablet Take 1 tablet (5 mg total) by mouth daily.  . metoprolol tartrate (LOPRESSOR) 25 MG tablet Take 0.5 tablets (12.5 mg total) by mouth 2 (two) times daily.  . Multiple Vitamins-Minerals (MULTIVITAMIN WITH MINERALS) tablet Take 1 tablet by mouth daily.  . nitroGLYCERIN (NITROSTAT) 0.4 MG SL tablet Place 1 tablet (0.4 mg  total) under the tongue every 5 (five) minutes as needed for chest pain.  . nitroGLYCERIN (NITROSTAT) 0.4 MG SL tablet Place 1 tablet (0.4 mg total) under the tongue every 5 (five) minutes as needed for chest pain.  . rosuvastatin (CRESTOR) 20 MG tablet Take 1 tablet (20 mg total) by mouth daily at 6 PM.  . ticagrelor (BRILINTA) 90 MG TABS tablet Take 1 tablet (90 mg total) by mouth 2 (two) times daily.    Kathe Becton,  MSN, FNP-C [facility] [address] [place], [place] [address] [phone]

## 2017-11-26 ENCOUNTER — Ambulatory Visit: Payer: Self-pay | Admitting: Physician Assistant

## 2017-12-03 MED FILL — METOPROLOL TARTRATE 25 MG T: 25 | 30 days supply | Qty: 30 | Fill #1

## 2017-12-03 MED FILL — LISINOPRIL 5 MG TABLET: 5 | 30 days supply | Qty: 30 | Fill #2

## 2017-12-03 MED FILL — ROSUVASTATIN CALCIUM 20 MG: 20 | 30 days supply | Qty: 30 | Fill #2

## 2017-12-03 MED FILL — BRILINTA 90 MG TABLET: 90 | 30 days supply | Qty: 60 | Fill #1

## 2018-01-01 MED FILL — LISINOPRIL 5 MG TAB: 5 | 30 days supply | Qty: 30 | Fill #3

## 2018-01-01 MED FILL — METOPROLOL TARTRATE 25 MG T: 25 | 30 days supply | Qty: 30 | Fill #2

## 2018-01-01 MED FILL — BRILINTA 90 MG TABLET: 90 | 30 days supply | Qty: 60 | Fill #2

## 2018-01-01 MED FILL — ROSUVASTATIN CALCIUM 20 MG: 20 | 30 days supply | Qty: 30 | Fill #3

## 2018-01-02 ENCOUNTER — Telehealth: Payer: Self-pay

## 2018-01-05 NOTE — Telephone Encounter (Signed)
Patient spoke with pharmacy and was advise on which medication would be safe for cold.

## 2018-01-14 ENCOUNTER — Ambulatory Visit (INDEPENDENT_AMBULATORY_CARE_PROVIDER_SITE_OTHER): Payer: Self-pay | Admitting: Family Medicine

## 2018-01-14 ENCOUNTER — Encounter: Payer: Self-pay | Admitting: Family Medicine

## 2018-01-14 VITALS — BP 126/78 | HR 70 | Temp 98.2°F | Ht 65.0 in | Wt 154.0 lb

## 2018-01-14 DIAGNOSIS — M25512 Pain in left shoulder: Secondary | ICD-10-CM

## 2018-01-14 DIAGNOSIS — N529 Male erectile dysfunction, unspecified: Secondary | ICD-10-CM

## 2018-01-14 DIAGNOSIS — I213 ST elevation (STEMI) myocardial infarction of unspecified site: Secondary | ICD-10-CM

## 2018-01-14 DIAGNOSIS — Z09 Encounter for follow-up examination after completed treatment for conditions other than malignant neoplasm: Secondary | ICD-10-CM

## 2018-01-14 DIAGNOSIS — I252 Old myocardial infarction: Secondary | ICD-10-CM

## 2018-01-14 DIAGNOSIS — C4490 Unspecified malignant neoplasm of skin, unspecified: Secondary | ICD-10-CM

## 2018-01-14 DIAGNOSIS — F419 Anxiety disorder, unspecified: Secondary | ICD-10-CM

## 2018-01-14 LAB — POCT URINALYSIS DIP (MANUAL ENTRY)
Bilirubin, UA: NEGATIVE
Blood, UA: NEGATIVE
Glucose, UA: NEGATIVE mg/dL
Ketones, POC UA: NEGATIVE mg/dL
Leukocytes, UA: NEGATIVE
Nitrite, UA: NEGATIVE
Protein Ur, POC: NEGATIVE mg/dL
Spec Grav, UA: 1.015 (ref 1.010–1.025)
Urobilinogen, UA: 0.2 E.U./dL
pH, UA: 6.5 (ref 5.0–8.0)

## 2018-01-14 MED ORDER — BUSPIRONE HCL 10 MG PO TABS: 10.0000 mg | ORAL_TABLET | Freq: Two times a day (BID) | ORAL | 2 refills | Status: DC

## 2018-01-14 NOTE — Progress Notes (Signed)
Follow Up  Subjective:    Patient ID: Adam [name], male    DOB: 1965/07/25, 52 y.o.   MRN: [id number]   Chief Complaint  Patient presents with  . Follow-up    Chronic condition    HPI  Mr. Cremer is a 52 year old male with a past medical history of MI, Hypertension, Kidney Stones, Head Injury, Carpal Tunnel, Carcinoma Scalp Lesions, Asthma, and Anxiety. He is here today for follow up.   Current Status: Since his last office visit, he is doing well with no complaints. He has follow up appointment with Cardiologist on 01/19/2018. Recently diagnosed carcinoma scalp lesions which were removed by the dermatology.He has occasional shortness of breath.  He continues to have left should pain. He denies visual changes, chest pain, cough, heart palpitations, and falls. He has occasionally headaches and dizziness with position changes. Denies severe headaches, confusion, seizures, double vision, and blurred vision, nausea and vomiting.  He denies fevers, chills, fatigue, recent infections, weight loss, and night sweats. No reports of GI problems such as diarrhea, and constipation. He has no reports of blood in stools, dysuria and hematuria. No depression or anxiety reported. He denies pain today.   Review of Systems  Constitutional: Negative.   HENT: Negative.   Eyes: Negative.   Respiratory: Negative.   Cardiovascular: Negative.   Gastrointestinal: Negative.   Endocrine: Negative.   Genitourinary: Negative.   Musculoskeletal: Negative.   Skin: Negative.   Allergic/Immunologic: Negative.   Neurological: Negative.   Hematological: Negative.   Psychiatric/Behavioral: Negative.    Objective:   Physical Exam Vitals signs and nursing note reviewed.  Constitutional:      Appearance: Normal appearance. He is normal weight.  HENT:     Head: Normocephalic and atraumatic.     Right Ear: Tympanic membrane, ear canal and external ear normal.     Left Ear: Tympanic membrane, ear canal and  external ear normal.     Nose: Nose normal.     Mouth/Throat:     Mouth: Mucous membranes are moist.     Pharynx: Oropharynx is clear.  Eyes:     Extraocular Movements: Extraocular movements intact.     Conjunctiva/sclera: Conjunctivae normal.     Pupils: Pupils are equal, round, and reactive to light.  Neck:     Musculoskeletal: Normal range of motion and neck supple.  Cardiovascular:     Rate and Rhythm: Normal rate and regular rhythm.     Pulses: Normal pulses.     Heart sounds: Normal heart sounds.  Pulmonary:     Effort: Pulmonary effort is normal.     Breath sounds: Normal breath sounds.  Abdominal:     General: Abdomen is flat. Bowel sounds are normal.     Palpations: Abdomen is soft.  Musculoskeletal: Normal range of motion.  Skin:    General: Skin is warm and dry.  Neurological:     General: No focal deficit present.     Mental Status: He is alert and oriented to person, place, and time.  Psychiatric:        Mood and Affect: Mood normal.        Behavior: Behavior normal.        Thought Content: Thought content normal.        Judgment: Judgment normal.    Assessment & Plan:   1. Status post myocardial infarction Stable. Continue Metoprolol and Brilinta as prescribed.   2. ST elevation myocardial infarction (STEMI), unspecified artery ([place])  3. Anxiety We will increase Buspar to 10 mg BID.  - busPIRone (BUSPAR) 10 MG tablet; Take 1 tablet (10 mg total) by mouth 2 (two) times daily.  Dispense: 60 tablet; Refill: 2  4. Skin cancer  5. Erectile dysfunction, unspecified erectile dysfunction type Continue Sildenafil as prescribed.   6. Left shoulder pain, unspecified chronicity Improving. He will continue Tramadol as prescribed.   7. Follow up He will follow up in 3 months. - POCT urinalysis dipstick  Meds ordered this encounter  Medications  . busPIRone (BUSPAR) 10 MG tablet    Sig: Take 1 tablet (10 mg total) by mouth 2 (two) times daily.    Dispense:   60 tablet    Refill:  [place],  MSN, FNP-C [facility] [address] [place], [place] [address] [phone]

## 2018-02-02 MED FILL — BRILINTA 90 MG TABLET: 90 | 30 days supply | Qty: 60 | Fill #3

## 2018-02-03 MED FILL — METOPROLOL TARTRATE 25 MG T: 25 | 30 days supply | Qty: 15 | Fill #0

## 2018-02-06 ENCOUNTER — Other Ambulatory Visit: Payer: Self-pay

## 2018-02-06 MED ORDER — ROSUVASTATIN CALCIUM 20 MG PO TABS: 20.0000 mg | ORAL_TABLET | Freq: Every day | ORAL | 3 refills | Status: DC

## 2018-02-06 MED ORDER — LISINOPRIL 5 MG PO TABS: 5.0000 mg | ORAL_TABLET | Freq: Every day | ORAL | 3 refills | Status: DC

## 2018-02-06 MED FILL — LISINOPRIL 5 MG TAB: 5 | 30 days supply | Qty: 30 | Fill #0

## 2018-02-06 MED FILL — ROSUVASTATIN CALCIUM 20 MG: 20 | 30 days supply | Qty: 30 | Fill #0

## 2018-02-06 NOTE — Telephone Encounter (Signed)
Medication sent to pharmacy  

## 2018-02-18 ENCOUNTER — Telehealth: Payer: Self-pay

## 2018-02-18 MED FILL — SILDENAFIL CITRATE 100 MG T: 100 | 30 days supply | Qty: 10 | Fill #1

## 2018-02-18 NOTE — Telephone Encounter (Signed)
Patient notified that we didn't order labs

## 2018-02-26 MED FILL — METOPROLOL SUCCINATE ER 25: 25 | 30 days supply | Qty: 15 | Fill #0

## 2018-03-04 MED FILL — BRILINTA 90 MG TABLET: 90 | 30 days supply | Qty: 60 | Fill #4

## 2018-03-04 MED FILL — LISINOPRIL 5 MG TAB: 5 | 30 days supply | Qty: 30 | Fill #1

## 2018-03-04 MED FILL — ROSUVASTATIN CALCIUM 20 MG: 20 | 30 days supply | Qty: 30 | Fill #1

## 2018-04-03 MED FILL — METOPROLOL SUCCINATE ER 25: 25 | 30 days supply | Qty: 15 | Fill #1

## 2018-04-03 MED FILL — ROSUVASTATIN CALCIUM 20 MG: 20 | 30 days supply | Qty: 30 | Fill #2

## 2018-04-03 MED FILL — LISINOPRIL 5 MG TAB: 5 | 30 days supply | Qty: 30 | Fill #2

## 2018-04-03 MED FILL — !BRILINTA 90 MG TABLET: 30 days supply | Qty: 60 | Fill #5

## 2018-04-15 ENCOUNTER — Other Ambulatory Visit: Payer: Self-pay

## 2018-04-15 ENCOUNTER — Ambulatory Visit: Payer: Self-pay | Admitting: Family Medicine

## 2018-04-17 MED FILL — BRILINTA 90 MG TABLET: 90 | 30 days supply | Qty: 60 | Fill #6

## 2018-04-27 MED FILL — ROSUVASTATIN CALCIUM 20 MG: 20 | 30 days supply | Qty: 30 | Fill #3

## 2018-04-27 MED FILL — SILDENAFIL CITRATE 100 MG T: 100 | 30 days supply | Qty: 10 | Fill #2

## 2018-04-27 MED FILL — LISINOPRIL 5 MG TAB: 5 | 30 days supply | Qty: 30 | Fill #3

## 2018-04-27 MED FILL — METOPROLOL SUCCINATE ER 25: 25 | 30 days supply | Qty: 15 | Fill #2

## 2018-05-19 ENCOUNTER — Encounter: Payer: Self-pay | Admitting: Family Medicine

## 2018-05-19 ENCOUNTER — Ambulatory Visit (INDEPENDENT_AMBULATORY_CARE_PROVIDER_SITE_OTHER): Payer: Self-pay | Admitting: Family Medicine

## 2018-05-19 ENCOUNTER — Other Ambulatory Visit: Payer: Self-pay

## 2018-05-19 VITALS — BP 116/68 | HR 70 | Temp 97.9°F | Ht 65.0 in | Wt 155.0 lb

## 2018-05-19 DIAGNOSIS — F419 Anxiety disorder, unspecified: Secondary | ICD-10-CM | POA: Insufficient documentation

## 2018-05-19 DIAGNOSIS — L089 Local infection of the skin and subcutaneous tissue, unspecified: Secondary | ICD-10-CM | POA: Insufficient documentation

## 2018-05-19 DIAGNOSIS — W57XXXA Bitten or stung by nonvenomous insect and other nonvenomous arthropods, initial encounter: Secondary | ICD-10-CM | POA: Insufficient documentation

## 2018-05-19 DIAGNOSIS — Z09 Encounter for follow-up examination after completed treatment for conditions other than malignant neoplasm: Secondary | ICD-10-CM

## 2018-05-19 MED ORDER — DOXYCYCLINE HYCLATE 100 MG PO TABS: 100.0000 mg | ORAL_TABLET | Freq: Two times a day (BID) | ORAL | 0 refills | Status: DC

## 2018-05-19 NOTE — Progress Notes (Signed)
Patient [name]   Sick Visit  Subjective:  Patient ID: Adam [name], male    DOB: 1965-03-23  Age: 53 y.o. MRN: [id number]  CC:  Chief Complaint  Patient presents with  . Animal Bite    Tick bite on right leg    HPI Adam [name] is a 53 year old who presents for a Sick Visit today.   Past Medical History:  Diagnosis Date  . Anxiety   . Asthma   . Carpal tunnel syndrome on both sides   . Head injury, acute, with loss of consciousness ([place])    hit on head with bucket at work- bucket fell.  [name] History of kidney stones   . Hypertension    no- was up a couple times whene he went to Dr , told he doesnt have HTN  . Myocardial infarction ([place])   . Tick bite    Current Status: Since his last office visit, he states that he was recently bitten by a deer tick in his groin area, on 05/15/2018. He has 5 bites he believes from the same tick. He c/o swelling, mild pain, and itching. He has used alcohol for mild relief. He states that he has had mild nausea for the past 2 days. He denies visual changes, chest pain, cough, shortness of breath, heart palpitations, and falls. he has occasional headaches and dizziness with position changes. Denies severe headaches, confusion, seizures, double vision, and blurred vision, nausea and vomiting. No reports of GI problems such as nausea, vomiting, diarrhea, and constipation. He has no reports of blood in stools, dysuria and hematuria. His anxiety is mild today. He denies suicidal ideations, homicidal ideations, or auditory hallucinations. He is accompanied by his wife today.   He denies fevers, chills, fatigue, recent infections, weight loss, and night sweats. He has not had any headaches, visual changes, dizziness, and falls. No chest pain, heart palpitations, cough and shortness of breath reported. He denies pain today.   Past Surgical History:  Procedure Laterality Date  . CARPAL TUNNEL RELEASE Right  02/03/2013   Procedure: RIGHT CARPAL TUNNEL RELEASE;  Surgeon: Schuyler Amor, MD;  Location: [facility];  Service: Orthopedics;  Laterality: Right;  . CORONARY STENT INTERVENTION N/A 10/07/2017   Procedure: CORONARY STENT INTERVENTION;  Surgeon: Nigel Mormon, MD;  Location: [facility];  Service: Cardiovascular;  Laterality: N/A;  . CORONARY/GRAFT ACUTE MI REVASCULARIZATION N/A 10/06/2017   Procedure: Coronary/Graft Acute MI Revascularization;  Surgeon: Nigel Mormon, MD;  Location: [facility];  Service: Cardiovascular;  Laterality: N/A;  . LEFT HEART CATH AND CORONARY ANGIOGRAPHY N/A 10/06/2017   Procedure: LEFT HEART CATH AND CORONARY ANGIOGRAPHY;  Surgeon: Nigel Mormon, MD;  Location: [facility];  Service: Cardiovascular;  Laterality: N/A;  . TEMPORARY PACEMAKER N/A 10/06/2017   Procedure: TEMPORARY PACEMAKER;  Surgeon: Nigel Mormon, MD;  Location: [facility];  Service: Cardiovascular;  Laterality: N/A;  . ULNA OSTEOTOMY Right 02/03/2013   Procedure: RIGHT ULNAR SHORTENING OSTEOTOMY;  Surgeon: Schuyler Amor, MD;  Location: [facility];  Service: Orthopedics;  Laterality: Right;  . WISDOM TOOTH EXTRACTION  1999  . WOUND EXPLORATION Left 09/18/2012   Procedure: EXPLORE AND REPAIR AS NEEDED LEFT WRIST;  Surgeon: Schuyler Amor, MD;  Location: [facility];  Service: Orthopedics;  Laterality: Left;  . WRIST ARTHROSCOPY Right 02/03/2013   Procedure: RIGHT WRIST ARTHROSCOPY WITH  DEBRIDEMENT;  Surgeon: Schuyler Amor, MD;  Location: [facility];  Service: Orthopedics;  Laterality: Right;    Family History  Problem Relation Age of Onset  . Hypertension Father   . Cancer Father   . Heart failure Father     Social History   Socioeconomic History  . Marital status: Married    Spouse name: Not on file  . Number of children: Not on file  . Years of education: Not on file  . Highest education  level: Not on file  Occupational History  . Not on file  Social Needs  . Financial resource strain: Somewhat hard  . Food insecurity:    Worry: Sometimes true    Inability: Sometimes true  . Transportation needs:    Medical: No    Non-medical: No  Tobacco Use  . Smoking status: Current Every Day Smoker    Packs/day: 1.00    Years: 25.00    Pack years: 25.00    Types: Cigarettes  . Smokeless tobacco: Current User  Substance and Sexual Activity  . Alcohol use: Yes    Comment: maybe 6 beers a month  . Drug use: No  . Sexual activity: Not on file  Lifestyle  . Physical activity:    Days per week: 4 days    Minutes per session: Not on file  . Stress: Only a little  Relationships  . Social connections:    Talks on phone: More than three times a week    Gets together: More than three times a week    Attends religious service: More than 4 times per year    Active member of club or organization: Not on file    Attends meetings of clubs or organizations: More than 4 times per year    Relationship status: Never married  . Intimate partner violence:    Fear of current or ex partner: No    Emotionally abused: No    Physically abused: No    Forced sexual activity: No  Other Topics Concern  . Not on file  Social History Narrative  . Not on file    Outpatient Medications Prior to Visit  Medication Sig Dispense Refill  . ALPRAZolam (XANAX) 0.25 MG tablet Take 1 tablet (0.25 mg total) by mouth 2 (two) times daily as needed for anxiety. 30 tablet 0  . aspirin 81 MG chewable tablet Chew 1 tablet (81 mg total) by mouth daily. 90 tablet 3  . calcium-vitamin D (OSCAL WITH D) 500-200 MG-UNIT tablet Take 1 tablet by mouth.    [name] lisinopril (PRINIVIL,ZESTRIL) 5 MG tablet Take 1 tablet (5 mg total) by mouth daily. 30 tablet 3  . metoprolol tartrate (LOPRESSOR) 25 MG tablet Take 0.5 tablets (12.5 mg total) by mouth 2 (two) times daily. 30 tablet 3  . Multiple Vitamins-Minerals (MULTIVITAMIN  WITH MINERALS) tablet Take 1 tablet by mouth daily.    . nitroGLYCERIN (NITROSTAT) 0.4 MG SL tablet Place 1 tablet (0.4 mg total) under the tongue every 5 (five) minutes as needed for chest pain. 30 tablet 1  . rosuvastatin (CRESTOR) 20 MG tablet Take 1 tablet (20 mg total) by mouth daily at 6 PM. 30 tablet 3  . sildenafil (VIAGRA) 100 MG tablet Take 0.5-1 tablets (50-100 mg total) by mouth daily as needed for erectile dysfunction. 5 tablet 11  . ticagrelor (BRILINTA) 90 MG TABS tablet Take 1 tablet (90 mg total) by mouth 2 (two) times daily. 180 tablet 3  .  busPIRone (BUSPAR) 10 MG tablet Take 1 tablet (10 mg total) by mouth 2 (two) times daily. (Patient not taking: Reported on 05/19/2018) 60 tablet 2  . pantoprazole (PROTONIX) 20 MG tablet Take 1 tablet (20 mg total) by mouth daily. 30 tablet 1  . traMADol (ULTRAM) 50 MG tablet Take 1 tablet (50 mg total) by mouth every 8 (eight) hours as needed. (Patient not taking: Reported on 05/19/2018) 30 tablet 0   No facility-administered medications prior to visit.     No Known Allergies  ROS Review of Systems  Constitutional: Negative.   HENT: Negative.   Eyes: Negative.   Respiratory: Negative.   Cardiovascular: Negative.   Gastrointestinal: Negative.   Endocrine: Negative.   Genitourinary: Negative.   Musculoskeletal: Negative.   Skin: Negative.        5 tick bites right inner thigh/groin area.   Allergic/Immunologic: Negative.   Neurological: Positive for dizziness and headaches.  Hematological: Negative.   Psychiatric/Behavioral: Negative.     Objective:    Physical Exam  Constitutional: He is oriented to person, place, and time. He appears well-developed and well-nourished.  HENT:  Head: Normocephalic and atraumatic.  Eyes: Conjunctivae are normal.  Neck: Normal range of motion. Neck supple.  Cardiovascular: Normal rate, regular rhythm, normal heart sounds and intact distal pulses.  Pulmonary/Chest: Effort normal and breath  sounds normal.  Abdominal: Soft. Bowel sounds are normal.  Musculoskeletal: Normal range of motion.  Neurological: He is alert and oriented to person, place, and time. He has normal reflexes.  Skin: Skin is warm and dry.  5 tick bites on right inner thigh and groin area Mild swelling and redness noted.   Psychiatric: He has a normal mood and affect. His behavior is normal. Judgment and thought content normal.  Vitals reviewed.   BP 116/68 (BP Location: Left Arm, Patient Position: Sitting, Cuff Size: Small)   Pulse 70   Temp 97.9 F (36.6 C) (Oral)   Ht 5\' 5"  (1.651 m)   Wt 155 lb (70.3 kg)   SpO2 100%   BMI 25.79 kg/m  Wt Readings from Last 3 Encounters:  05/19/18 155 lb (70.3 kg)  01/14/18 154 lb (69.9 kg)  11/25/17 150 lb 12.8 oz (68.4 kg)     Health Maintenance Due  Topic Date Due  . COLONOSCOPY  05/10/2017    There are no preventive care reminders to display for this patient.  Lab Results  Component Value Date   TSH 2.620 10/15/2017   Lab Results  Component Value Date   WBC 12.6 (H) 10/15/2017   HGB 13.3 10/15/2017   HCT 41.5 10/15/2017   MCV 89 10/15/2017   PLT 527 (H) 10/15/2017   Lab Results  Component Value Date   NA 137 10/08/2017   K 3.8 10/08/2017   CO2 21 (L) 10/08/2017   GLUCOSE 97 10/08/2017   BUN 7 10/08/2017   CREATININE 0.79 10/08/2017   BILITOT 0.7 10/06/2017   ALKPHOS 73 10/06/2017   AST 44 (H) 10/06/2017   ALT 37 10/06/2017   PROT 6.0 (L) 10/06/2017   ALBUMIN 3.3 (L) 10/06/2017   CALCIUM 8.2 (L) 10/08/2017   ANIONGAP 9 10/08/2017   Lab Results  Component Value Date   CHOL 127 10/15/2017   Lab Results  Component Value Date   HDL 50 10/15/2017   Lab Results  Component Value Date   LDLCALC 63 10/15/2017   Lab Results  Component Value Date   TRIG 71 10/15/2017   Lab Results  Component Value Date   CHOLHDL 2.5 10/15/2017   Lab Results  Component Value Date   HGBA1C 5.5 10/06/2017   Assessment & Plan:   1. Tick  bite, initial encounter   - Lyme Ab/Western Blot Reflex - doxycycline (VIBRA-TABS) 100 MG tablet; Take 1 tablet (100 mg total) by mouth 2 (two) times daily for 10 days.  Dispense: 20 tablet; Refill: 0  2. Skin infection We will initiate Doxycycline today.  - doxycycline (VIBRA-TABS) 100 MG tablet; Take 1 tablet (100 mg total) by mouth 2 (two) times daily for 10 days.  Dispense: 20 tablet; Refill: 0  3. Anxiety Stable today.   4. Follow up He will follow up in 6 months.    Meds ordered this encounter  Medications  . doxycycline (VIBRA-TABS) 100 MG tablet    Sig: Take 1 tablet (100 mg total) by mouth 2 (two) times daily for 10 days.    Dispense:  20 tablet    Refill:  0    Orders Placed This Encounter  Procedures  . Lyme Ab/Western Blot Reflex    Referral Orders  No referral(s) requested today    Kathe Becton,  MSN, FNP-C [facility] [place], [place] [address] [phone]    Problem List Items Addressed This Visit    None    Visit Diagnoses    Tick bite, initial encounter    -  Primary   Relevant Medications   doxycycline (VIBRA-TABS) 100 MG tablet   Other Relevant Orders   Lyme Ab/Western Blot Reflex   Skin infection       Relevant Medications   doxycycline (VIBRA-TABS) 100 MG tablet   Anxiety       Follow up          Meds ordered this encounter  Medications  . doxycycline (VIBRA-TABS) 100 MG tablet    Sig: Take 1 tablet (100 mg total) by mouth 2 (two) times daily for 10 days.    Dispense:  20 tablet    Refill:  0    Follow-up: Return in about 6 months (around 11/18/2018).    Azzie Glatter, FNP

## 2018-05-19 NOTE — Patient Instructions (Signed)
Doxycycline tablets or capsules  What is this medicine?  DOXYCYCLINE (dox i SYE kleen) is a tetracycline antibiotic. It kills certain bacteria or stops their growth. It is used to treat many kinds of infections, like dental, skin, respiratory, and urinary tract infections. It also treats acne, Lyme disease, malaria, and certain sexually transmitted infections.  This medicine may be used for other purposes; ask your health care provider or pharmacist if you have questions.  COMMON BRAND NAME(S): Acticlate, Adoxa, Adoxa CK, Adoxa Pak, Adoxa TT, Alodox, Avidoxy, Doxal, LYMEPAK, Mondoxyne NL, Monodox, Morgidox 1x, Morgidox 1x Kit, Morgidox 2x, Morgidox 2x Kit, NutriDox, Ocudox, TARGADOX, Vibra-Tabs, Vibramycin  What should I tell my health care provider before I take this medicine?  They need to know if you have any of these conditions:  -liver disease  -long exposure to sunlight like working outdoors  -stomach problems like colitis  -an unusual or allergic reaction to doxycycline, tetracycline antibiotics, other medicines, foods, dyes, or preservatives  -pregnant or trying to get pregnant  -breast-feeding  How should I use this medicine?  Take this medicine by mouth with a full glass of water. Follow the directions on the prescription label. It is best to take this medicine without food, but if it upsets your stomach take it with food. Take your medicine at regular intervals. Do not take your medicine more often than directed. Take all of your medicine as directed even if you think you are better. Do not skip doses or stop your medicine early.  Talk to your pediatrician regarding the use of this medicine in children. While this drug may be prescribed for selected conditions, precautions do apply.  Overdosage: If you think you have taken too much of this medicine contact a poison control center or emergency room at once.  NOTE: This medicine is only for you. Do not share this medicine with others.  What if I miss a  dose?  If you miss a dose, take it as soon as you can. If it is almost time for your next dose, take only that dose. Do not take double or extra doses.  What may interact with this medicine?  -antacids  -barbiturates  -birth control pills  -bismuth subsalicylate  -carbamazepine  -methoxyflurane  -other antibiotics  -phenytoin  -vitamins that contain iron  -warfarin  This list may not describe all possible interactions. Give your health care provider a list of all the medicines, herbs, non-prescription drugs, or dietary supplements you use. Also tell them if you smoke, drink alcohol, or use illegal drugs. Some items may interact with your medicine.  What should I watch for while using this medicine?  Tell your doctor or health care professional if your symptoms do not improve.  Do not treat diarrhea with over the counter products. Contact your doctor if you have diarrhea that lasts more than 2 days or if it is severe and watery.  Do not take this medicine just before going to bed. It may not dissolve properly when you lay down and can cause pain in your throat. Drink plenty of fluids while taking this medicine to also help reduce irritation in your throat.  This medicine can make you more sensitive to the sun. Keep out of the sun. If you cannot avoid being in the sun, wear protective clothing and use sunscreen. Do not use sun lamps or tanning beds/booths.  Birth control pills may not work properly while you are taking this medicine. Talk to your doctor about using   and iron products for 4 hours before and 2 hours after taking a dose of this medicine. If you are using this medicine to prevent malaria, you should still protect yourself from contact with mosquitos. Stay in  screened-in areas, use mosquito nets, keep your body covered, and use an insect repellent. What side effects may I notice from receiving this medicine? Side effects that you should report to your doctor or health care professional as soon as possible: -allergic reactions like skin rash, itching or hives, swelling of the face, lips, or tongue -difficulty breathing -fever -itching in the rectal or genital area -pain on swallowing -redness, blistering, peeling or loosening of the skin, including inside the mouth -severe stomach pain or cramps -unusual bleeding or bruising -unusually weak or tired -yellowing of the eyes or skin Side effects that usually do not require medical attention (report to your doctor or health care professional if they continue or are bothersome): -diarrhea -loss of appetite -nausea, vomiting This list may not describe all possible side effects. Call your doctor for medical advice about side effects. You may report side effects to FDA at 1-800-FDA-1088. Where should I keep my medicine? Keep out of the reach of children. Store at room temperature, below 30 degrees C (86 degrees F). Protect from light. Keep container tightly closed. Throw away any unused medicine after the expiration date. Taking this medicine after the expiration date can make you seriously ill. NOTE: This sheet is a summary. It may not cover all possible information. If you have questions about this medicine, talk to your doctor, pharmacist, or health care provider.  2019 Elsevier/Gold Standard (2015-02-15 17:11:22)      Tick Bite Information, Adult  Ticks are insects that can bite. Most ticks live in shrubs and grassy areas. They climb onto people and animals that go by. Then they bite. Some ticks carry germs that can make you sick. How can I prevent tick bites?  Use an insect repellent that has 20% or higher of the ingredients DEET, picaridin, or IR3535. Put this insect repellent on: ? Bare  skin. ? The tops of your boots. ? Your pant legs. ? The ends of your sleeves.  If you use an insect repellent that has the ingredient permethrin, make sure to follow the instructions on the bottle. Treat the following: ? Clothing. ? Supplies. ? Boots. ? Tents.  Wear long sleeves, long pants, and light colors.  Tuck your pant legs into your socks.  Stay in the middle of the trail.  Try not to walk through long grass.  Before going inside your house, check your clothes, hair, and skin for ticks. Make sure to check your head, neck, armpits, waist, groin, and joint areas.  Check for ticks every day.  When you come indoors: ? Wash your clothes right away. ? Shower right away. ? Dry your clothes in a dryer on high heat for 60 minutes or more. What is the right way to remove a tick? Remove a tick from your skin as soon as possible.  To remove a tick that is crawling on your skin: ? Go outdoors and brush the tick off. ? Use tape or a lint roller.  To remove a tick that is biting: ? Wash your hands. ? If you have latex gloves, put them on. ? Use tweezers, curved forceps, or a tick-removal tool to grasp the tick. Grasp the tick as close to your skin and as close to the tick's head as possible. ? Gently  pull up until the tick lets go.  Try to keep the tick's head attached to its body.  Do not twist or jerk the tick.  Do not squeeze or crush the tick. Do not try to remove a tick with heat, alcohol, petroleum jelly, or fingernail polish. How should I get rid of a tick? Here are some ways to get rid of a tick that is alive:  Place the tick in rubbing alcohol.  Place the tick in a bag or container you can close tightly.  Wrap the tick tightly in tape.  Flush the tick down the toilet. Contact a doctor if:  You have symptoms of a disease, such as: ? Pain in a muscle, joint, or bone. ? Trouble walking or moving your legs. ? Numbness in your legs. ? Inability to move  (paralysis). ? A red rash that makes a circle (bull's-eye rash). ? Redness and swelling where the tick bit you. ? A fever. ? Throwing up (vomiting) over and over. ? Diarrhea. ? Weight loss. ? Tender and swollen lymph glands. ? Shortness of breath. ? Cough. ? Belly pain (abdominal pain). ? Headache. ? Being more tired than normal. ? A change in how alert (conscious) you are. ? Confusion. Get help right away if:  You cannot remove a tick.  A part of a tick breaks off and gets stuck in your skin.  You are feeling worse. Summary  Ticks may carry germs that can make you sick.  To prevent tick bites, wear long sleeves, long pants, and light colors. Use insect repellent. Follow the instructions on the bottle.  If the tick is biting, do not try to remove it with heat, alcohol, petroleum jelly, or fingernail polish.  Use tweezers, curved forceps, or a tick-removal tool to grasp the tick. Gently pull up until the tick lets go. Do not twist or jerk the tick. Do not squeeze or crush the tick.  If you have symptoms, contact a doctor. This information is not intended to replace advice given to you by your health care provider. Make sure you discuss any questions you have with your health care provider. Document Released: 04/10/2009 Document Revised: 04/26/2016 Document Reviewed: 04/26/2016 Elsevier Interactive Patient Education  2019 [employer].

## 2018-05-20 LAB — LYME AB/WESTERN BLOT REFLEX
LYME DISEASE AB, QUANT, IGM: <0.8 index (ref 0.00–0.79)
Lyme IgG/IgM Ab: <0.91 {ISR} (ref 0.00–0.90)

## 2018-05-29 ENCOUNTER — Other Ambulatory Visit: Payer: Self-pay | Admitting: Family Medicine

## 2018-05-29 MED FILL — BRILINTA 90 MG TABLET: 90 | 30 days supply | Qty: 60 | Fill #7

## 2018-05-29 MED FILL — ?METOPROLOL SUCC ER 25MG TA: 25 | 30 days supply | Qty: 15 | Fill #3

## 2018-06-02 ENCOUNTER — Ambulatory Visit: Payer: Self-pay | Admitting: Family Medicine

## 2018-06-08 ENCOUNTER — Telehealth: Payer: Self-pay

## 2018-06-08 ENCOUNTER — Other Ambulatory Visit: Payer: Self-pay | Admitting: Family Medicine

## 2018-06-08 ENCOUNTER — Other Ambulatory Visit: Payer: Self-pay

## 2018-06-08 DIAGNOSIS — I10 Essential (primary) hypertension: Secondary | ICD-10-CM

## 2018-06-08 DIAGNOSIS — E7800 Pure hypercholesterolemia, unspecified: Secondary | ICD-10-CM

## 2018-06-08 MED ORDER — ROSUVASTATIN CALCIUM 20 MG PO TABS: 20.0000 mg | ORAL_TABLET | Freq: Every day | ORAL | 3 refills | Status: DC

## 2018-06-08 MED ORDER — LISINOPRIL 5 MG PO TABS: 5.0000 mg | ORAL_TABLET | Freq: Every day | ORAL | 3 refills | Status: DC

## 2018-06-08 MED FILL — LISINOPRIL 5 MG TAB: 5 | 30 days supply | Qty: 30 | Fill #0

## 2018-06-08 MED FILL — ?ROSUVASTATIN CALCIUM 20MG: 20 | 30 days supply | Qty: 30 | Fill #0

## 2018-06-08 NOTE — Telephone Encounter (Signed)
Spoke with his wife. Apparently he's gone to work. I have asked the wife, if he has chest pain or syncope, he should be taken to the ED instead. If not, recommend in office follow up visit. Will need an EKG.  Thanks MJP

## 2018-06-08 NOTE — Telephone Encounter (Signed)
Pt wife called stating that pt was throwing up and sweating starting yesterday with a hr of 110 while sleeping; Pt still doesn't feel well today but he did go into work; Wife is concerned because last time he had a MI he had the flu like symptoms. Please advise   [phone]

## 2018-06-16 ENCOUNTER — Other Ambulatory Visit: Payer: Self-pay

## 2018-06-16 ENCOUNTER — Encounter: Payer: Self-pay | Admitting: Cardiology

## 2018-06-16 ENCOUNTER — Ambulatory Visit (INDEPENDENT_AMBULATORY_CARE_PROVIDER_SITE_OTHER): Payer: Self-pay | Admitting: Cardiology

## 2018-06-16 VITALS — BP 149/92 | HR 68 | Temp 98.4°F | Ht 65.0 in | Wt 151.0 lb

## 2018-06-16 DIAGNOSIS — R61 Generalized hyperhidrosis: Secondary | ICD-10-CM

## 2018-06-16 DIAGNOSIS — I2510 Atherosclerotic heart disease of native coronary artery without angina pectoris: Secondary | ICD-10-CM

## 2018-06-16 DIAGNOSIS — I10 Essential (primary) hypertension: Secondary | ICD-10-CM

## 2018-06-16 DIAGNOSIS — Z87891 Personal history of nicotine dependence: Secondary | ICD-10-CM

## 2018-06-16 NOTE — Progress Notes (Signed)
Primary Physician:  Azzie Glatter, FNP   Patient ID: Adam [name], male    DOB: [birth year], 53 y.o.   MRN: [id number]  Subjective:    Chief Complaint  Patient presents with  . Tachycardia  . Follow-up    HPI: Adam [name]  is a 53 y.o. 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 Symptoms started approximately 2 weeks ago.   He continues to be on DAPT. Denies any chest pain. shortness of breath, leg edema, orthopnea, PND, presncope, syncope, TIA. He walks 20,000 steps a day and walked 60 miles in last one week. States some days are easier than others. He denies any excessive bleeding issues.  He does mention that he was bitten by 5 ticks a few weeks ago and was treated with Doxycycline by PCP for 10 days. Lyme disease was negative. He reports since being treated a few weeks ago, he has again been bitten by 5 ticks.  Past Medical History:  Diagnosis Date  . Anxiety   . Asthma   . Carpal tunnel syndrome on both sides   . Head injury, acute, with loss of consciousness ([place])    hit on head with bucket at work- bucket fell.  [name] History of kidney stones   . Hypertension    no- was up a couple times whene he went to Dr , told he doesnt have HTN  . Myocardial infarction ([place])   . Tick bite     Past Surgical History:  Procedure Laterality Date  . CARPAL TUNNEL RELEASE Right 02/03/2013   Procedure: RIGHT CARPAL TUNNEL RELEASE;  Surgeon: Schuyler Amor, MD;  Location: [place] [facility];  Service: Orthopedics;  Laterality: Right;  . CORONARY STENT INTERVENTION N/A 10/07/2017   Procedure: CORONARY STENT INTERVENTION;  Surgeon: Nigel Mormon, MD;  Location: [facility];  Service: Cardiovascular;  Laterality: N/A;  . CORONARY/GRAFT ACUTE MI REVASCULARIZATION N/A 10/06/2017   Procedure: Coronary/Graft Acute MI Revascularization;  Surgeon: Nigel Mormon, MD;  Location: [place] [facility];  Service: Cardiovascular;  Laterality: N/A;  . LEFT HEART CATH AND CORONARY ANGIOGRAPHY N/A 10/06/2017   Procedure: LEFT HEART CATH AND CORONARY ANGIOGRAPHY;  Surgeon: Nigel Mormon, MD;  Location: [facility];  Service: Cardiovascular;  Laterality: N/A;  . TEMPORARY PACEMAKER N/A 10/06/2017   Procedure: TEMPORARY PACEMAKER;  Surgeon: Nigel Mormon, MD;  Location: [facility] [facility];  Service: Cardiovascular;  Laterality: N/A;  . ULNA OSTEOTOMY Right 02/03/2013   Procedure: RIGHT ULNAR SHORTENING OSTEOTOMY;  Surgeon: Schuyler Amor, MD;  Location: [place];  Service: Orthopedics;  Laterality: Right;  . WISDOM TOOTH EXTRACTION  1999  . WOUND EXPLORATION Left 09/18/2012   Procedure: EXPLORE AND REPAIR AS NEEDED LEFT WRIST;  Surgeon: Schuyler Amor, MD;  Location: [facility];  Service: Orthopedics;  Laterality: Left;  . WRIST ARTHROSCOPY Right 02/03/2013   Procedure: RIGHT WRIST ARTHROSCOPY WITH DEBRIDEMENT;  Surgeon: Schuyler Amor, MD;  Location: [facility];  Service: Orthopedics;  Laterality: Right;    Social History   Socioeconomic History  . Marital status: Significant Other    Spouse name: Not on file  . Number of children: 2  . Years of education: Not on file  . Highest education level: Not on file  Occupational  History  . Not on file  Social Needs  . Financial resource strain: Somewhat hard  . Food insecurity:    Worry: Sometimes true    Inability: Sometimes true  . Transportation needs:    Medical: No    Non-medical: No  Tobacco Use  . Smoking status: Former Smoker    Packs/day: 1.00    Years: 25.00    Pack years: 25.00    Types: Cigarettes    Last attempt to quit: 09/2017    Years since quitting: 0.7  . Smokeless tobacco: Current User  Substance and Sexual Activity  . Alcohol use: Yes    Comment:  maybe 6 beers a month  . Drug use: No  . Sexual activity: Not on file  Lifestyle  . Physical activity:    Days per week: 4 days    Minutes per session: Not on file  . Stress: Only a little  Relationships  . Social connections:    Talks on phone: More than three times a week    Gets together: More than three times a week    Attends religious service: More than 4 times per year    Active member of club or organization: Not on file    Attends meetings of clubs or organizations: More than 4 times per year    Relationship status: Never married  . Intimate partner violence:    Fear of current or ex partner: No    Emotionally abused: No    Physically abused: No    Forced sexual activity: No  Other Topics Concern  . Not on file  Social History Narrative  . Not on file    Review of Systems  Constitution: Negative for decreased appetite, malaise/fatigue, weight gain and weight loss.  Eyes: Negative for visual disturbance.  Cardiovascular: Negative for chest pain, claudication, dyspnea on exertion, leg swelling, orthopnea, palpitations and syncope.       Night sweats  Respiratory: Negative for hemoptysis and wheezing.   Endocrine: Negative for cold intolerance and heat intolerance.  Hematologic/Lymphatic: Does not bruise/bleed easily.  Skin: Negative for nail changes.  Musculoskeletal: Negative for muscle weakness and myalgias.  Gastrointestinal: Positive for nausea and vomiting. Negative for abdominal pain and change in bowel habit.  Neurological: Negative for difficulty with concentration, dizziness, focal weakness, headaches and weakness.  Psychiatric/Behavioral: Negative for altered mental status and suicidal ideas.  All other systems reviewed and are negative.     Objective:  Blood pressure (!) 149/92, pulse 68, temperature 98.4 F (36.9 C), height 5\' 5"  (1.651 m), weight 151 lb (68.5 kg), SpO2 100 %. Body mass index is 25.13 kg/m.    Physical Exam  Constitutional: He is  oriented to person, place, and time. Vital signs are normal. He appears well-developed and well-nourished.  HENT:  Head: Normocephalic and atraumatic.  Neck: Normal range of motion.  Cardiovascular: Normal rate, regular rhythm, normal heart sounds and intact distal pulses.  Pulmonary/Chest: Effort normal and breath sounds normal. No accessory muscle usage. No respiratory distress.  Abdominal: Soft. Bowel sounds are normal.  Musculoskeletal: Normal range of motion.  Neurological: He is alert and oriented to person, place, and time.  Skin: Skin is warm and dry.  Vitals reviewed.  Radiology: No results found.  Laboratory examination:    CMP Latest Ref Rng & Units 10/08/2017 10/07/2017 10/06/2017  Glucose 70 - 99 mg/dL 97 109(H) 102(H)  BUN 6 - 20 mg/dL 7 8 13   Creatinine 0.61 - 1.24 mg/dL 0.79 0.81  1.07  Sodium 135 - 145 mmol/L 137 136 138  Potassium 3.5 - 5.1 mmol/L 3.8 4.0 4.0  Chloride 98 - 111 mmol/L 107 108 104  CO2 22 - 32 mmol/L 21(L) 21(L) 23  Calcium 8.9 - 10.3 mg/dL 8.2(L) 8.1(L) 8.8(L)  Total Protein 6.5 - 8.1 g/dL - - -  Total Bilirubin 0.3 - 1.2 mg/dL - - -  Alkaline Phos 38 - 126 U/L - - -  AST 15 - 41 U/L - - -  ALT 0 - 44 U/L - - -   CBC Latest Ref Rng & Units 10/15/2017 10/08/2017 10/07/2017  WBC 3.4 - 10.8 x10E3/uL 12.6(H) 19.2(H) 18.1(H)  Hemoglobin 13.0 - 17.7 g/dL 13.3 12.1(L) 12.0(L)  Hematocrit 37.5 - 51.0 % 41.5 36.6(L) 35.6(L)  Platelets 150 - 450 x10E3/uL 527(H) 259 251   Lipid Panel     Component Value Date/Time   CHOL 127 10/15/2017 0921   TRIG 71 10/15/2017 0921   HDL 50 10/15/2017 0921   CHOLHDL 2.5 10/15/2017 0921   CHOLHDL 4.6 10/06/2017 0215   VLDL 46 (H) 10/06/2017 0215   LDLCALC 63 10/15/2017 0921   HEMOGLOBIN A1C Lab Results  Component Value Date   HGBA1C 5.5 10/06/2017   MPG 111 10/06/2017   TSH Recent Labs    10/15/17 0921  TSH 2.620    PRN Meds:. Medications Discontinued During This Encounter  Medication Reason  .  busPIRone (BUSPAR) 10 MG tablet Patient Preference   Current Meds  Medication Sig  . ALPRAZolam (XANAX) 0.25 MG tablet Take 1 tablet (0.25 mg total) by mouth 2 (two) times daily as needed for anxiety.  [name] aspirin 81 MG chewable tablet Chew 1 tablet (81 mg total) by mouth daily.  . calcium-vitamin D (OSCAL WITH D) 500-200 MG-UNIT tablet Take 1 tablet by mouth.  [name] lisinopril (ZESTRIL) 5 MG tablet Take 1 tablet (5 mg total) by mouth daily.  . metoprolol tartrate (LOPRESSOR) 25 MG tablet Take 0.5 tablets (12.5 mg total) by mouth 2 (two) times daily. (Patient taking differently: Take 25 mg by mouth daily. )  . Multiple Vitamins-Minerals (MULTIVITAMIN WITH MINERALS) tablet Take 1 tablet by mouth daily.  . nitroGLYCERIN (NITROSTAT) 0.4 MG SL tablet Place 1 tablet (0.4 mg total) under the tongue every 5 (five) minutes as needed for chest pain.  . rosuvastatin (CRESTOR) 20 MG tablet Take 1 tablet (20 mg total) by mouth daily at 6 PM.  . sildenafil (VIAGRA) 100 MG tablet Take 0.5-1 tablets (50-100 mg total) by mouth daily as needed for erectile dysfunction.  . ticagrelor (BRILINTA) 90 MG TABS tablet Take 1 tablet (90 mg total) by mouth 2 (two) times daily.    Cardiac Studies:   Echocardiogram 02/17/2018: Left ventricle cavity is normal in size. Mild concentric hypertrophy of the left ventricle. Low normal global wall motion. Visual EF is 50-55%. Normal diastolic filling pattern. No significant valvular abnormality. No evidence of pulmonary hypertension.  Cath 10/07/2017: LM: Normal LAD: Prox-mid tandem 95% stenoses Successful PTCA and stent placement Synergy DES 3.5 X 38 mm DES LCx: Mild disease RCA: S/p primary PCI for inferior STEMI on 10/06/2017 Successful percutaneous coronary intervention PTCA and stent placement 2.75 X 20 mm Synergy drug-eluting stent dRCA 2.75 X 16 mm Synergy drug-eluting stent pRCA Mild residual mid and distal  RCA/PDA disease  Assessment:   Night sweats - Plan: EKG 12-Lead, PCV MYOCARDIAL PERFUSION WITH LEXISCAN  Atherosclerosis of native coronary artery of native heart without angina pectoris - Plan: PCV MYOCARDIAL  PERFUSION WITH LEXISCAN  Former tobacco use  Essential hypertension  EKG 06/16/2018: Normal sinus rhythm at 65 bpm, normal axis, T wave inversion/flattening in lead 3 and aVL, cannot exclude ischemia. No changes compared to EKG 09/2017.  Recommendations:   Patient has been having episodes of night sweats and GI upset with nausea and vomiting similar to prior to his MI in 09/2017 for the last 2 weeks. No acute abnormalities on EKG today. He has not had any chest pain or shortness of breath, but does have some fatigue and not feeling well. I have recommended lexiscan nuclear stress test for further evaluation due to his similar symptoms prior to his. If stress test is unyielding, suspect his symptoms may be related to tick bite. May need longer duration of doxycycline.   Remains abstinent from tobacco use since his MI. I have congratulated him on this. Blood pressure is elevated today, but generally well controlled. Will continue to monitor at home. Will keep previously scheduled office visit for Sept 2020, but will notify him of stress test results and if needed will make sooner follow up.   Miquel Dunn, MSN, APRN, FNP-C [facility]. [place] Office: [phone] Fax: [phone]

## 2018-06-17 ENCOUNTER — Encounter: Payer: Self-pay | Admitting: Cardiology

## 2018-06-18 ENCOUNTER — Telehealth: Payer: Self-pay

## 2018-06-18 NOTE — Progress Notes (Signed)
Did you see him

## 2018-06-19 NOTE — Telephone Encounter (Signed)
Patient has had 5 more tick bites and needs another round of antibiotic

## 2018-06-21 ENCOUNTER — Other Ambulatory Visit: Payer: Self-pay | Admitting: Family Medicine

## 2018-06-21 DIAGNOSIS — W57XXXA Bitten or stung by nonvenomous insect and other nonvenomous arthropods, initial encounter: Secondary | ICD-10-CM

## 2018-06-21 DIAGNOSIS — L089 Local infection of the skin and subcutaneous tissue, unspecified: Secondary | ICD-10-CM

## 2018-06-21 MED ORDER — DOXYCYCLINE HYCLATE 100 MG PO TABS: 100.0000 mg | ORAL_TABLET | Freq: Two times a day (BID) | ORAL | 0 refills | Status: AC

## 2018-07-02 MED FILL — ?METOPROLOL SUCC ER 25MG TA: 25 | 30 days supply | Qty: 15 | Fill #4

## 2018-07-02 MED FILL — ?ROSUVASTATIN CALCIUM 20MG: 20 | 30 days supply | Qty: 30 | Fill #1

## 2018-07-02 MED FILL — LISINOPRIL 5 MG TAB: 5 | 30 days supply | Qty: 30 | Fill #1

## 2018-07-02 MED FILL — BRILINTA 90 MG TABLET: 90 | 30 days supply | Qty: 60 | Fill #8

## 2018-08-03 MED FILL — LISINOPRIL 5 MG TAB: 5 | 30 days supply | Qty: 30 | Fill #2

## 2018-08-03 MED FILL — ?METOPROLOL SUCC ER 25MG TA: 25 | 30 days supply | Qty: 15 | Fill #5

## 2018-08-03 MED FILL — ?ROSUVASTATIN CALCIUM 20MG: 20 | 30 days supply | Qty: 30 | Fill #2

## 2018-08-03 MED FILL — BRILINTA 90 MG TABLET: 90 | 30 days supply | Qty: 60 | Fill #9

## 2018-08-04 MED FILL — SILDENAFIL CITRATE 100 MG T: 100 | 30 days supply | Qty: 10 | Fill #3

## 2018-09-04 MED FILL — LISINOPRIL 5 MG TAB: 5 | 30 days supply | Qty: 30 | Fill #3

## 2018-09-04 MED FILL — ?ROSUVASTATIN CALCIUM 20MG: 20 | 30 days supply | Qty: 30 | Fill #3

## 2018-09-04 MED FILL — ?METOPROLOL SUCC ER 25MG TA: 25 | 30 days supply | Qty: 15 | Fill #6

## 2018-09-04 MED FILL — BRILINTA 90 MG TABLET: 90 | 30 days supply | Qty: 60 | Fill #10

## 2018-10-08 MED FILL — ?ROSUVASTATIN CALCIUM 20MG: 20 | 30 days supply | Qty: 30 | Fill #4

## 2018-10-08 MED FILL — ?METOPROLOL SUCC ER 25MG TA: 25 | 30 days supply | Qty: 15 | Fill #7

## 2018-10-08 MED FILL — LISINOPRIL 5 MG TABLET: 5 | 30 days supply | Qty: 30 | Fill #4

## 2018-10-08 MED FILL — BRILINTA 90 MG TABLET: 90 | 30 days supply | Qty: 60 | Fill #11

## 2018-10-27 ENCOUNTER — Encounter: Payer: Self-pay | Admitting: Cardiology

## 2018-10-28 ENCOUNTER — Ambulatory Visit: Payer: Self-pay | Admitting: Cardiology

## 2018-11-06 ENCOUNTER — Other Ambulatory Visit: Payer: Self-pay | Admitting: Cardiology

## 2018-11-06 MED FILL — ?ROSUVASTATIN CALCIUM 20MG: 20 | 30 days supply | Qty: 30 | Fill #5

## 2018-11-06 MED FILL — SILDENAFIL CITRATE 100 MG T: 100 | 30 days supply | Qty: 10 | Fill #4

## 2018-11-06 MED FILL — LISINOPRIL 5 MG TABLET: 5 | 30 days supply | Qty: 30 | Fill #5

## 2018-11-06 MED FILL — ?METOPROLOL SUCC ER 25MG TA: 25 | 30 days supply | Qty: 15 | Fill #8

## 2018-11-09 ENCOUNTER — Ambulatory Visit (INDEPENDENT_AMBULATORY_CARE_PROVIDER_SITE_OTHER): Payer: Self-pay | Admitting: Cardiology

## 2018-11-09 ENCOUNTER — Encounter: Payer: Self-pay | Admitting: Cardiology

## 2018-11-09 ENCOUNTER — Other Ambulatory Visit: Payer: Self-pay

## 2018-11-09 VITALS — BP 135/100 | HR 82 | Temp 98.6°F | Ht 65.0 in | Wt 150.4 lb

## 2018-11-09 DIAGNOSIS — I252 Old myocardial infarction: Secondary | ICD-10-CM | POA: Insufficient documentation

## 2018-11-09 DIAGNOSIS — I2510 Atherosclerotic heart disease of native coronary artery without angina pectoris: Secondary | ICD-10-CM

## 2018-11-09 HISTORY — DX: Atherosclerotic heart disease of native coronary artery without angina pectoris: I25.10

## 2018-11-09 MED ORDER — ROSUVASTATIN CALCIUM 10 MG PO TABS: 10.0000 mg | ORAL_TABLET | Freq: Every day | ORAL | 3 refills | Status: DC

## 2018-11-09 MED ORDER — LISINOPRIL 5 MG PO TABS: 5.0000 mg | ORAL_TABLET | Freq: Every day | ORAL | 3 refills | Status: DC

## 2018-11-09 MED ORDER — METOPROLOL SUCCINATE ER 25 MG PO TB24: 12.5000 mg | ORAL_TABLET | Freq: Every day | ORAL | 3 refills | Status: DC

## 2018-11-09 MED ORDER — TICAGRELOR 60 MG PO TABS: 60.0000 mg | ORAL_TABLET | Freq: Two times a day (BID) | ORAL | 2 refills | Status: DC

## 2018-11-09 MED FILL — !BRILINTA 90 MG TABLET: 30 days supply | Qty: 60 | Fill #0

## 2018-11-09 NOTE — Progress Notes (Signed)
Follow up visit  Subjective:   Adam [name], male    DOB: 1965/12/15, 53 y.o.   MRN: [id number]   Chief Complaint  Patient presents with  . Coronary Artery Disease    patient ran out of meds      HPI  53 y/o Caucasian male with CAD s/p PPCI for inferior STEMI (09/2017), hypertension, h/o tobacco abuse.  Patient is doing well. He walks 50-70 miles/week without any complaints of chest pain, shortness of breath. He "feels his heart rate" beating fast when it gets up to 150 bpm, but is able to continue his walking through it without any difficulty.   He has ran out of metoprolol, Crestor, and Brilinta for last two weeks.   Past Medical History:  Diagnosis Date  . Anxiety   . Asthma   . Carpal tunnel syndrome on both sides   . Head injury, acute, with loss of consciousness ([name])    hit on head with bucket at work- bucket fell.  [name] History of kidney stones   . Hypertension    no- was up a couple times whene he went to Dr , told he doesnt have HTN  . Myocardial infarction ([name])   . Tick bite      Past Surgical History:  Procedure Laterality Date  . CARPAL TUNNEL RELEASE Right 02/03/2013   Procedure: RIGHT CARPAL TUNNEL RELEASE;  Surgeon: Adam Amor, MD;  Location: [name];  Service: Orthopedics;  Laterality: Right;  . CORONARY STENT INTERVENTION N/A 10/07/2017   Procedure: CORONARY STENT INTERVENTION;  Surgeon: Adam Mormon, MD;  Location: [name];  Service: Cardiovascular;  Laterality: N/A;  . CORONARY/GRAFT ACUTE MI REVASCULARIZATION N/A 10/06/2017   Procedure: Coronary/Graft Acute MI Revascularization;  Surgeon: Adam Mormon, MD;  Location: [name];  Service: Cardiovascular;  Laterality: N/A;  . LEFT HEART CATH AND CORONARY ANGIOGRAPHY N/A 10/06/2017   Procedure: LEFT HEART CATH AND CORONARY ANGIOGRAPHY;  Surgeon: Adam Mormon, MD;  Location: [name];  Service: Cardiovascular;  Laterality: N/A;   . TEMPORARY PACEMAKER N/A 10/06/2017   Procedure: TEMPORARY PACEMAKER;  Surgeon: Adam Mormon, MD;  Location: [name];  Service: Cardiovascular;  Laterality: N/A;  . ULNA OSTEOTOMY Right 02/03/2013   Procedure: RIGHT ULNAR SHORTENING OSTEOTOMY;  Surgeon: Adam Amor, MD;  Location: [name];  Service: Orthopedics;  Laterality: Right;  . WISDOM TOOTH EXTRACTION  1999  . WOUND EXPLORATION Left 09/18/2012   Procedure: EXPLORE AND REPAIR AS NEEDED LEFT WRIST;  Surgeon: Adam Amor, MD;  Location: [name];  Service: Orthopedics;  Laterality: Left;  . WRIST ARTHROSCOPY Right 02/03/2013   Procedure: RIGHT WRIST ARTHROSCOPY WITH DEBRIDEMENT;  Surgeon: Adam Amor, MD;  Location: [name];  Service: Orthopedics;  Laterality: Right;     Social History   Socioeconomic History  . Marital status: Significant Other    Spouse name: Not on file  . Number of children: 2  . Years of education: Not on file  . Highest education level: Not on file  Occupational History  . Not on file  Social Needs  . Financial resource strain: Somewhat hard  . Food insecurity    Worry: Sometimes true    Inability: Sometimes true  . Transportation needs    Medical: No    Non-medical: No  Tobacco Use  . Smoking status: Former Smoker    Packs/day: 1.00  Years: 25.00    Pack years: 25.00    Types: Cigarettes    Quit date: 09/2017    Years since quitting: 1.1  . Smokeless tobacco: Current User  Substance and Sexual Activity  . Alcohol use: Yes    Comment: maybe 6 beers a month  . Drug use: No  . Sexual activity: Not on file  Lifestyle  . Physical activity    Days per week: 4 days    Minutes per session: Not on file  . Stress: Only a little  Relationships  . Social connections    Talks on phone: More than three times a week    Gets together: More than three times a week    Attends religious service: More than 4 times per year    Active  member of club or organization: Not on file    Attends meetings of clubs or organizations: More than 4 times per year    Relationship status: Never married  . Intimate partner violence    Fear of current or ex partner: No    Emotionally abused: No    Physically abused: No    Forced sexual activity: No  Other Topics Concern  . Not on file  Social History Narrative  . Not on file     Family History  Problem Relation Age of Onset  . Hypertension [name]   . Cancer Adam [name]   . Heart failure [name]      Current Outpatient Medications on File Prior to Visit  Medication Sig Dispense Refill  . aspirin 81 MG chewable tablet Chew 1 tablet (81 mg total) by mouth daily. 90 tablet 3  . calcium-vitamin D (OSCAL WITH D) 500-200 MG-UNIT tablet Take 1 tablet by mouth.    . Multiple Vitamins-Minerals (MULTIVITAMIN WITH MINERALS) tablet Take 1 tablet by mouth daily.    . nitroGLYCERIN (NITROSTAT) 0.4 MG SL tablet Place 1 tablet (0.4 mg total) under the tongue every 5 (five) minutes as needed for chest pain. 30 tablet 1  . sildenafil (VIAGRA) 100 MG tablet Take 0.5-1 tablets (50-100 mg total) by mouth daily as needed for erectile dysfunction. 5 tablet 11   No current facility-administered medications on file prior to visit.     Cardiovascular studies:  EKG 11/09/2018: Sinus rhythm 83 bpm.  Old inferior infarct.    Echocardiogram 02/17/2018: Left ventricle cavity is normal in size. Mild concentric hypertrophy of the left ventricle. Low normal global wall motion. Visual EF is 50-55%. Normal diastolic filling pattern. No significant valvular abnormality. No evidence of pulmonary hypertension.  Coronary intervention 10/07/2017: LM: Normal LAD: Prox-mid tandem 95% stenoses Successful PTCA and stent placement Synergy DES 3.5 X 38 mm DES LCx: Mild disease RCA: S/p primary PCI for inferior STEMI on 10/06/2017 Successful percutaneous coronary intervention PTCA  and stent placement 2.75 X 20 mm Synergy drug-eluting stent dRCA 2.75 X 16 mm Synergy drug-eluting stent pRCA Mild residual mid and distal RCA/PDA disease  Recent labs:  Results for [name] (MRN [id number]) as of 11/09/2018 10:09  Ref. Range 10/08/2017 02:34  Sodium Latest Ref Range: 135 - 145 mmol/L 137  Potassium Latest Ref Range: 3.5 - 5.1 mmol/L 3.8  Chloride Latest Ref Range: 98 - 111 mmol/L 107  CO2 Latest Ref Range: 22 - 32 mmol/L 21 (L)  Glucose Latest Ref Range: 70 - 99 mg/dL 97  BUN Latest Ref Range: 6 - 20 mg/dL 7  Creatinine Latest Ref Range: 0.61 - 1.24 mg/dL 0.79  Calcium Latest  Ref Range: 8.9 - 10.3 mg/dL 8.2 (L)  Anion gap Latest Ref Range: 5 - 15  9  GFR, Est Non African American Latest Ref Range: >60 mL/min >60  GFR, Est African American Latest Ref Range: >60 mL/min >60   Results for DANIELJAMES, HALFPENNY (MRN [id number]) as of 11/09/2018 10:09  Ref. Range 10/15/2017 09:21  WBC Latest Ref Range: 3.4 - 10.8 x10E3/uL 12.6 (H)  RBC Latest Ref Range: 4.14 - 5.80 x10E6/uL 4.68  Hemoglobin Latest Ref Range: 13.0 - 17.7 g/dL 13.3  HCT Latest Ref Range: 37.5 - 51.0 % 41.5  MCV Latest Ref Range: 79 - 97 fL 89  MCH Latest Ref Range: 26.6 - 33.0 pg 28.4  MCHC Latest Ref Range: 31.5 - 35.7 g/dL 32.0  RDW Latest Ref Range: 12.3 - 15.4 % 13.0  Platelets Latest Ref Range: 150 - 450 x10E3/uL 527 (H)   Results for DALTIN, SERANO (MRN [id number]) as of 11/09/2018 10:09  Ref. Range 10/15/2017 09:21  Total CHOL/HDL Ratio Latest Ref Range: 0.0 - 5.0 ratio 2.5  Cholesterol, Total Latest Ref Range: 100 - 199 mg/dL 127  HDL Cholesterol Latest Ref Range: >39 mg/dL 50  LDL (calc) Latest Ref Range: 0 - 99 mg/dL 63  Triglycerides Latest Ref Range: 0 - 149 mg/dL 71  VLDL Cholesterol Cal Latest Ref Range: 5 - 40 mg/dL 14   Results for JOVEL, BUCKALEW (MRN [id number]) as of 11/09/2018 10:09  Ref. Range 10/06/2017 06:47 10/07/2017 03:30 10/08/2017 02:34  10/15/2017 09:21  Glucose Latest Ref Range: 70 - 99 mg/dL  109 (H) 97   Hemoglobin A1C Latest Ref Range: 4.8 - 5.6 % 5.5     TSH Latest Ref Range: 0.450 - 4.500 uIU/mL    2.620    Review of Systems  Constitution: Negative for decreased appetite, malaise/fatigue, weight gain and weight loss.  HENT: Negative for congestion.   Eyes: Negative for visual disturbance.  Cardiovascular: Negative for chest pain, dyspnea on exertion, leg swelling, palpitations and syncope.  Respiratory: Negative for cough.   Endocrine: Negative for cold intolerance.  Hematologic/Lymphatic: Does not bruise/bleed easily.  Skin: Negative for itching and rash.  Musculoskeletal: Negative for myalgias.  Gastrointestinal: Negative for abdominal pain, nausea and vomiting.  Genitourinary: Negative for dysuria.  Neurological: Negative for dizziness and weakness.  Psychiatric/Behavioral: The patient is not nervous/anxious.   All other systems reviewed and are negative.       Vitals:   11/09/18 1447  BP: (!) 135/100  Pulse: 82  Temp: 98.6 F (37 C)  SpO2: 98%     Body mass index is 25.03 kg/m. Filed Weights   11/09/18 1447  Weight: 150 lb 6.4 oz (68.2 kg)     Objective:   Physical Exam  Constitutional: He is oriented to person, place, and time. He appears well-developed and well-nourished. No distress.  HENT:  Head: Normocephalic and atraumatic.  Eyes: Pupils are equal, round, and reactive to light. Conjunctivae are normal.  Neck: No JVD present.  Cardiovascular: Normal rate, regular rhythm and intact distal pulses.  No murmur heard. Pulmonary/Chest: Effort normal and breath sounds normal. He has no wheezes. He has no rales.  Abdominal: Soft. Bowel sounds are normal. There is no rebound.  Musculoskeletal:        General: No edema.  Lymphadenopathy:    He has no cervical adenopathy.  Neurological: He is alert and oriented to person, place, and time. No cranial nerve deficit.  Skin: Skin is warm and  dry.  Psychiatric: He has a normal mood and affect.  Nursing note and vitals reviewed.         Assessment & Recommendations:   53 y/o Caucasian male with CAD s/p PPCI for inferior STEMI (09/2017), hypertension, h/o tobacco abuse.  CAD: STEMI in 09/2017. Successful primary PCI mid and distal RCA, nonculprit vessel PCI to prox-mid LAD. Completed 1 year DAPT post STEMI. Given multivessel PCI, and lack of bleeding, recommend Aspirin 81 mg and Brilinta 60 mg bid till 09/2020 for ischemia benefits. Refilled lisinopril 5 mg, switched metoprolol tartarate to metoprolol succinate 12.5 mg daily. Okay to reduce crestor to 10 mg daily, given his low LDl.  F/u in 6 months.    Adam Mormon, MD [facility] Pager: [phone] Office: [phone] If no answer Cell [phone]

## 2018-11-10 ENCOUNTER — Ambulatory Visit (INDEPENDENT_AMBULATORY_CARE_PROVIDER_SITE_OTHER): Payer: Self-pay | Admitting: Family Medicine

## 2018-11-10 ENCOUNTER — Other Ambulatory Visit: Payer: Self-pay

## 2018-11-10 ENCOUNTER — Encounter: Payer: Self-pay | Admitting: Family Medicine

## 2018-11-10 VITALS — BP 144/86 | HR 81 | Temp 97.8°F | Ht 65.0 in | Wt 152.2 lb

## 2018-11-10 DIAGNOSIS — Z09 Encounter for follow-up examination after completed treatment for conditions other than malignant neoplasm: Secondary | ICD-10-CM

## 2018-11-10 DIAGNOSIS — F410 Panic disorder [episodic paroxysmal anxiety] without agoraphobia: Secondary | ICD-10-CM | POA: Insufficient documentation

## 2018-11-10 DIAGNOSIS — F419 Anxiety disorder, unspecified: Secondary | ICD-10-CM

## 2018-11-10 DIAGNOSIS — I10 Essential (primary) hypertension: Secondary | ICD-10-CM | POA: Insufficient documentation

## 2018-11-10 LAB — POCT URINALYSIS DIPSTICK
Bilirubin, UA: NEGATIVE
Glucose, UA: NEGATIVE
Ketones, UA: NEGATIVE
Leukocytes, UA: NEGATIVE
Nitrite, UA: NEGATIVE
Protein, UA: NEGATIVE
Spec Grav, UA: 1.015 (ref 1.010–1.025)
Urobilinogen, UA: 0.2 E.U./dL
pH, UA: 7.5 (ref 5.0–8.0)

## 2018-11-10 MED ORDER — ALPRAZOLAM 0.25 MG PO TABS: 0.2500 mg | ORAL_TABLET | Freq: Two times a day (BID) | ORAL | 0 refills | Status: AC | PRN

## 2018-11-10 NOTE — Progress Notes (Signed)
Patient [name]  Sick Visit  Subjective:  Patient ID: Adam [name], male    DOB: Jun 11, 1965  Age: 53 y.o. MRN: [id number]  CC:  Chief Complaint  Patient presents with   Anxiety    frequent panic attacks     HPI Adam [name] is a 54 year old male who presents for Sick Visit today.   Past Medical History:  Diagnosis Date   Anxiety    Asthma    Carpal tunnel syndrome on both sides    Coronary artery disease involving native coronary artery of native heart without angina pectoris 11/09/2018   Head injury, acute, with loss of consciousness ([place])    hit on head with bucket at work- bucket fell.   History of kidney stones    Hypertension    no- was up a couple times whene he went to Dr , told he doesnt have HTN   Myocardial infarction ([place])    Tick bite    Current Status: Since his last office visit, patient has c/o increased anxiety lately. He has had anxiety attacks. He denies suicidal ideations, homicidal ideations, or auditory hallucinations. He recently followed up with Cardiologist, he had several EKG and all were stable. No chest pain, heart palpitations, cough and shortness of breath reported. He denies fevers, chills, fatigue, recent infections, weight loss, and night sweats. He has not had any headaches, visual changes, dizziness, and falls. No reports of GI problems such as nausea, vomiting, diarrhea, and constipation. He has no reports of blood in stools, dysuria and hematuria. He denies pain today.   Past Surgical History:  Procedure Laterality Date   CARPAL TUNNEL RELEASE Right 02/03/2013   Procedure: RIGHT CARPAL TUNNEL RELEASE;  Surgeon: Schuyler Amor, MD;  Location: [place];  Service: Orthopedics;  Laterality: Right;   CORONARY STENT INTERVENTION N/A 10/07/2017   Procedure: CORONARY STENT INTERVENTION;  Surgeon: Nigel Mormon, MD;  Location: [place] [facility];  Service:  Cardiovascular;  Laterality: N/A;   CORONARY/GRAFT ACUTE MI REVASCULARIZATION N/A 10/06/2017   Procedure: Coronary/Graft Acute MI Revascularization;  Surgeon: Nigel Mormon, MD;  Location: [place] [facility];  Service: Cardiovascular;  Laterality: N/A;   LEFT HEART CATH AND CORONARY ANGIOGRAPHY N/A 10/06/2017   Procedure: LEFT HEART CATH AND CORONARY ANGIOGRAPHY;  Surgeon: Nigel Mormon, MD;  Location: [place] [facility];  Service: Cardiovascular;  Laterality: N/A;   TEMPORARY PACEMAKER N/A 10/06/2017   Procedure: TEMPORARY PACEMAKER;  Surgeon: Nigel Mormon, MD;  Location: [place] [facility];  Service: Cardiovascular;  Laterality: N/A;   ULNA OSTEOTOMY Right 02/03/2013   Procedure: RIGHT ULNAR SHORTENING OSTEOTOMY;  Surgeon: Schuyler Amor, MD;  Location: [place];  Service: Orthopedics;  Laterality: Right;   WISDOM TOOTH EXTRACTION  1999   WOUND EXPLORATION Left 09/18/2012   Procedure: EXPLORE AND REPAIR AS NEEDED LEFT WRIST;  Surgeon: Schuyler Amor, MD;  Location: [place];  Service: Orthopedics;  Laterality: Left;   WRIST ARTHROSCOPY Right 02/03/2013   Procedure: RIGHT WRIST ARTHROSCOPY WITH DEBRIDEMENT;  Surgeon: Schuyler Amor, MD;  Location: [place];  Service: Orthopedics;  Laterality: Right;    Family History  Problem Relation Age of Onset   Hypertension Father    Cancer Father    Heart failure Father     Social History   Socioeconomic History   Marital status: Significant Other    Spouse name: Not  on file   Number of children: 2   Years of education: Not on file   Highest education level: Not on file  Occupational History   Not on file  Social Needs   Financial resource strain: Somewhat hard   Food insecurity    Worry: Sometimes true    Inability: Sometimes true   Transportation needs    Medical: No    Non-medical: No  Tobacco Use   Smoking status: Former Smoker    Packs/day: 1.00    Years:  25.00    Pack years: 25.00    Types: Cigarettes    Quit date: 09/2017    Years since quitting: 1.1   Smokeless tobacco: Current User  Substance and Sexual Activity   Alcohol use: Yes    Comment: maybe 6 beers a month   Drug use: No   Sexual activity: Not on file  Lifestyle   Physical activity    Days per week: 4 days    Minutes per session: Not on file   Stress: Only a little  Relationships   Social connections    Talks on phone: More than three times a week    Gets together: More than three times a week    Attends religious service: More than 4 times per year    Active member of club or organization: Not on file    Attends meetings of clubs or organizations: More than 4 times per year    Relationship status: Never married   Intimate partner violence    Fear of current or ex partner: No    Emotionally abused: No    Physically abused: No    Forced sexual activity: No  Other Topics Concern   Not on file  Social History Narrative   Not on file    Outpatient Medications Prior to Visit  Medication Sig Dispense Refill   aspirin 81 MG chewable tablet Chew 1 tablet (81 mg total) by mouth daily. 90 tablet 3   calcium-vitamin D (OSCAL WITH D) 500-200 MG-UNIT tablet Take 1 tablet by mouth.     lisinopril (ZESTRIL) 5 MG tablet Take 1 tablet (5 mg total) by mouth daily. 990 tablet 3   metoprolol succinate (TOPROL XL) 25 MG 24 hr tablet Take 0.5 tablets (12.5 mg total) by mouth daily. 90 tablet 3   Multiple Vitamins-Minerals (MULTIVITAMIN WITH MINERALS) tablet Take 1 tablet by mouth daily.     nitroGLYCERIN (NITROSTAT) 0.4 MG SL tablet Place 1 tablet (0.4 mg total) under the tongue every 5 (five) minutes as needed for chest pain. 30 tablet 1   rosuvastatin (CRESTOR) 10 MG tablet Take 1 tablet (10 mg total) by mouth daily. 90 tablet 3   sildenafil (VIAGRA) 100 MG tablet Take 0.5-1 tablets (50-100 mg total) by mouth daily as needed for erectile dysfunction. 5 tablet 11     ticagrelor (BRILINTA) 60 MG TABS tablet Take 1 tablet (60 mg total) by mouth 2 (two) times daily. 120 tablet 2   No facility-administered medications prior to visit.     No Known Allergies  ROS Review of Systems  Constitutional: Negative.   HENT: Negative.   Eyes: Negative.   Respiratory: Positive for shortness of breath (r/t panic attacks).   Cardiovascular: Negative.   Gastrointestinal: Negative.   Endocrine: Negative.   Genitourinary: Negative.   Musculoskeletal: Negative.   Skin: Negative.   Allergic/Immunologic: Negative.   Neurological: Positive for dizziness and headaches.  Hematological: Negative.   Psychiatric/Behavioral: The patient is  nervous/anxious.       Objective:    Physical Exam  Constitutional: He is oriented to person, place, and time. He appears well-developed and well-nourished.  HENT:  Head: Normocephalic and atraumatic.  Eyes: Conjunctivae are normal.  Neck: Normal range of motion. Neck supple.  Cardiovascular: Normal rate, regular rhythm, normal heart sounds and intact distal pulses.  Pulmonary/Chest: Effort normal and breath sounds normal.  Abdominal: Soft. Bowel sounds are normal.  Musculoskeletal: Normal range of motion.  Neurological: He is alert and oriented to person, place, and time. He has normal reflexes.  Skin: Skin is warm and dry.  Psychiatric: He has a normal mood and affect. His behavior is normal. Judgment and thought content normal.  Nursing note and vitals reviewed.   BP (!) 144/86 (BP Location: Right Arm, Patient Position: Sitting, Cuff Size: Normal) Comment: per pt b/p was elevated at heart doctor yesterday   Pulse 81    Temp 97.8 F (36.6 C) (Oral)    Ht 5\' 5"  (1.651 m)    Wt 152 lb 3.2 oz (69 kg)    SpO2 98%    BMI 25.33 kg/m  Wt Readings from Last 3 Encounters:  11/10/18 152 lb 3.2 oz (69 kg)  11/09/18 150 lb 6.4 oz (68.2 kg)  06/16/18 151 lb (68.5 kg)     Health Maintenance Due  Topic Date Due   COLONOSCOPY   05/10/2017   INFLUENZA VACCINE  08/29/2018    There are no preventive care reminders to display for this patient.  Lab Results  Component Value Date   TSH 2.620 10/15/2017   Lab Results  Component Value Date   WBC 12.6 (H) 10/15/2017   HGB 13.3 10/15/2017   HCT 41.5 10/15/2017   MCV 89 10/15/2017   PLT 527 (H) 10/15/2017   Lab Results  Component Value Date   NA 137 10/08/2017   K 3.8 10/08/2017   CO2 21 (L) 10/08/2017   GLUCOSE 97 10/08/2017   BUN 7 10/08/2017   CREATININE 0.79 10/08/2017   BILITOT 0.7 10/06/2017   ALKPHOS 73 10/06/2017   AST 44 (H) 10/06/2017   ALT 37 10/06/2017   PROT 6.0 (L) 10/06/2017   ALBUMIN 3.3 (L) 10/06/2017   CALCIUM 8.2 (L) 10/08/2017   ANIONGAP 9 10/08/2017   Lab Results  Component Value Date   CHOL 127 10/15/2017   Lab Results  Component Value Date   HDL 50 10/15/2017   Lab Results  Component Value Date   LDLCALC 63 10/15/2017   Lab Results  Component Value Date   TRIG 71 10/15/2017   Lab Results  Component Value Date   CHOLHDL 2.5 10/15/2017   Lab Results  Component Value Date   HGBA1C 5.5 10/06/2017      Assessment & Plan:   1. Hypertension, unspecified type The current medical regimen is effective; blood pressure is stable at 144/86 today; continue present plan and medications as prescribed. He will continue to take medications as prescribed, to decrease high sodium intake, excessive alcohol intake, increase potassium intake, smoking cessation, and increase physical activity of at least 30 minutes of cardio activity daily. He will continue to follow Heart Healthy or DASH diet.4 - POCT urinalysis dipstick - TSH - Lipid Panel - CBC with Differential - Comprehensive metabolic panel - Vitamin 123456 - Vitamin D, 25-hydroxy - PSA  2. Anxiety - ALPRAZolam (XANAX) 0.25 MG tablet; Take 1 tablet (0.25 mg total) by mouth 2 (two) times daily as needed for anxiety.  Dispense: 30 tablet; Refill: 0 - Ambulatory referral to  Psychiatry  3. Panic attacks Occasional attacks. Stable today.  - ALPRAZolam (XANAX) 0.25 MG tablet; Take 1 tablet (0.25 mg total) by mouth 2 (two) times daily as needed for anxiety.  Dispense: 30 tablet; Refill: 0 - Ambulatory referral to Psychiatry  4. Follow up He will up in 3 months.   Meds ordered this encounter  Medications   ALPRAZolam (XANAX) 0.25 MG tablet    Sig: Take 1 tablet (0.25 mg total) by mouth 2 (two) times daily as needed for anxiety.    Dispense:  30 tablet    Refill:  0    Orders Placed This Encounter  Procedures   TSH   Lipid Panel   CBC with Differential   Comprehensive metabolic panel   Vitamin 123456   Vitamin D, 25-hydroxy   PSA   Ambulatory referral to Psychiatry   POCT urinalysis dipstick     Referral Orders     Ambulatory referral to Psychiatry   Kathe Becton,  MSN, FNP-BC [address] [place], [place] [address] [phone] fax   Problem List Items Addressed This Visit      Other   Anxiety   Relevant Medications   ALPRAZolam (XANAX) 0.25 MG tablet   Other Relevant Orders   Ambulatory referral to Psychiatry    Other Visit Diagnoses    Hypertension, unspecified type    -  Primary   Relevant Orders   POCT urinalysis dipstick (Completed)   TSH   Lipid Panel   CBC with Differential   Comprehensive metabolic panel   Vitamin 123456   Vitamin D, 25-hydroxy   PSA   Panic attacks       Relevant Medications   ALPRAZolam (XANAX) 0.25 MG tablet   Other Relevant Orders   Ambulatory referral to Psychiatry   Follow up          Meds ordered this encounter  Medications   ALPRAZolam (XANAX) 0.25 MG tablet    Sig: Take 1 tablet (0.25 mg total) by mouth 2 (two) times daily as needed for anxiety.    Dispense:  30 tablet    Refill:  0    Follow-up: Return in about 3 months (around 02/10/2019).    Azzie Glatter, FNP

## 2018-11-11 ENCOUNTER — Other Ambulatory Visit: Payer: Self-pay

## 2018-11-11 DIAGNOSIS — I2510 Atherosclerotic heart disease of native coronary artery without angina pectoris: Secondary | ICD-10-CM

## 2018-11-11 DIAGNOSIS — I252 Old myocardial infarction: Secondary | ICD-10-CM

## 2018-11-11 LAB — LIPID PANEL
Chol/HDL Ratio: 3.2 ratio (ref 0.0–5.0)
Cholesterol, Total: 268 mg/dL — ABNORMAL HIGH (ref 100–199)
HDL: 83 mg/dL (ref >39)
LDL Chol Calc (NIH): 164 mg/dL — ABNORMAL HIGH (ref 0–99)
Triglycerides: 124 mg/dL (ref 0–149)
VLDL Cholesterol Cal: 21 mg/dL (ref 5–40)

## 2018-11-11 LAB — PSA: Prostate Specific Ag, Serum: 0.7 ng/mL (ref 0.0–4.0)

## 2018-11-11 LAB — COMPREHENSIVE METABOLIC PANEL
ALT: 22 IU/L (ref 0–44)
AST: 23 IU/L (ref 0–40)
Albumin/Globulin Ratio: 1.6 (ref 1.2–2.2)
Albumin: 4.8 g/dL (ref 3.8–4.9)
Alkaline Phosphatase: 94 IU/L (ref 39–117)
BUN/Creatinine Ratio: 12 (ref 9–20)
BUN: 11 mg/dL (ref 6–24)
Bilirubin Total: 0.4 mg/dL (ref 0.0–1.2)
CO2: 23 mmol/L (ref 20–29)
Calcium: 9.9 mg/dL (ref 8.7–10.2)
Chloride: 101 mmol/L (ref 96–106)
Creatinine, Ser: 0.92 mg/dL (ref 0.76–1.27)
GFR calc Af Amer: 111 mL/min/{1.73_m2} (ref >59)
GFR calc non Af Amer: 96 mL/min/{1.73_m2} (ref >59)
Globulin, Total: 3 g/dL (ref 1.5–4.5)
Glucose: 75 mg/dL (ref 65–99)
Potassium: 4.8 mmol/L (ref 3.5–5.2)
Sodium: 139 mmol/L (ref 134–144)
Total Protein: 7.8 g/dL (ref 6.0–8.5)

## 2018-11-11 LAB — CBC WITH DIFFERENTIAL/PLATELET
Basophils Absolute: 0.1 10*3/uL (ref 0.0–0.2)
Basos: 1 %
EOS (ABSOLUTE): 0.2 10*3/uL (ref 0.0–0.4)
Eos: 2 %
Hematocrit: 47.1 % (ref 37.5–51.0)
Hemoglobin: 15.8 g/dL (ref 13.0–17.7)
Immature Grans (Abs): 0 10*3/uL (ref 0.0–0.1)
Immature Granulocytes: 0 %
Lymphocytes Absolute: 2.6 10*3/uL (ref 0.7–3.1)
Lymphs: 32 %
MCH: 29.7 pg (ref 26.6–33.0)
MCHC: 33.5 g/dL (ref 31.5–35.7)
MCV: 89 fL (ref 79–97)
Monocytes Absolute: 1.2 10*3/uL — ABNORMAL HIGH (ref 0.1–0.9)
Monocytes: 15 %
Neutrophils Absolute: 4.1 10*3/uL (ref 1.4–7.0)
Neutrophils: 50 %
Platelets: 362 10*3/uL (ref 150–450)
RBC: 5.32 x10E6/uL (ref 4.14–5.80)
RDW: 13.7 % (ref 11.6–15.4)
WBC: 8.3 10*3/uL (ref 3.4–10.8)

## 2018-11-11 LAB — TSH: TSH: 1.7 u[IU]/mL (ref 0.450–4.500)

## 2018-11-11 LAB — VITAMIN D 25 HYDROXY (VIT D DEFICIENCY, FRACTURES): Vit D, 25-Hydroxy: 23.3 ng/mL — ABNORMAL LOW (ref 30.0–100.0)

## 2018-11-11 LAB — VITAMIN B12: Vitamin B-12: 802 pg/mL (ref 232–1245)

## 2018-11-11 MED ORDER — NITROGLYCERIN 0.4 MG SL SUBL: 0.4000 mg | SUBLINGUAL_TABLET | SUBLINGUAL | 1 refills | Status: AC | PRN

## 2018-11-11 MED ORDER — LISINOPRIL 5 MG PO TABS: 5.0000 mg | ORAL_TABLET | Freq: Every day | ORAL | 2 refills | Status: DC

## 2018-11-11 MED ORDER — ROSUVASTATIN CALCIUM 10 MG PO TABS: 10.0000 mg | ORAL_TABLET | Freq: Every day | ORAL | 2 refills | Status: DC

## 2018-11-11 MED ORDER — TICAGRELOR 60 MG PO TABS: 60.0000 mg | ORAL_TABLET | Freq: Two times a day (BID) | ORAL | 2 refills | Status: DC

## 2018-11-11 MED ORDER — METOPROLOL SUCCINATE ER 25 MG PO TB24: 12.5000 mg | ORAL_TABLET | Freq: Every day | ORAL | 2 refills | Status: DC

## 2018-11-12 MED FILL — ?ROSUVASTATIN CALCIUM 10 MG: 10 | 30 days supply | Qty: 30 | Fill #0

## 2018-11-12 MED FILL — BRILINTA 60 MG TABLET: 60 | 30 days supply | Qty: 60 | Fill #0

## 2018-11-18 ENCOUNTER — Ambulatory Visit: Payer: Self-pay | Admitting: Family Medicine

## 2018-11-20 ENCOUNTER — Other Ambulatory Visit: Payer: Self-pay | Admitting: Family Medicine

## 2018-11-20 DIAGNOSIS — F410 Panic disorder [episodic paroxysmal anxiety] without agoraphobia: Secondary | ICD-10-CM

## 2018-11-20 DIAGNOSIS — F419 Anxiety disorder, unspecified: Secondary | ICD-10-CM

## 2018-11-30 ENCOUNTER — Telehealth: Payer: Self-pay | Admitting: Family Medicine

## 2018-11-30 NOTE — Telephone Encounter (Signed)
-----   Message from Azzie Glatter, [place] sent at 11/20/2018  7:12 AM EDT ----- Regarding: "Mental Health Services" FYI:   Agencies for [place] at a decreased rate:    [place] [phone]

## 2018-11-30 NOTE — Telephone Encounter (Signed)
Called, no answer. Need to advise the only place that will help with no insurance is [employer]. I will provide him with number when he returns call.

## 2018-12-07 ENCOUNTER — Other Ambulatory Visit: Payer: Self-pay | Admitting: Family Medicine

## 2018-12-07 DIAGNOSIS — N529 Male erectile dysfunction, unspecified: Secondary | ICD-10-CM

## 2018-12-07 MED FILL — ?METOPROLOL SUCC ER 25MG TA: 25 | 30 days supply | Qty: 15 | Fill #0

## 2018-12-07 MED FILL — SILDENAFIL CITRATE 100 MG T: 100 | 30 days supply | Qty: 10 | Fill #0

## 2018-12-07 MED FILL — ?ROSUVASTATIN CALCIUM 10 MG: 10 | 30 days supply | Qty: 30 | Fill #1

## 2018-12-07 MED FILL — LISINOPRIL 5 MG TABLET: 5 | 30 days supply | Qty: 30 | Fill #0

## 2018-12-07 MED FILL — $BRILINTA 60 MG TABLET: 60 | 90 days supply | Qty: 180 | Fill #1

## 2019-01-05 MED FILL — ?METOPROLOL SUCC ER 25MG TA: 25 | 30 days supply | Qty: 15 | Fill #1

## 2019-01-05 MED FILL — SILDENAFIL CITRATE 100 MG T: 100 | 30 days supply | Qty: 10 | Fill #1

## 2019-01-05 MED FILL — LISINOPRIL 5 MG TABLET: 5 | 30 days supply | Qty: 30 | Fill #1

## 2019-01-05 MED FILL — ?ROSUVASTATIN CALCIUM 10 MG: 10 | 30 days supply | Qty: 30 | Fill #2

## 2019-02-02 MED FILL — METOPROLOL SUCCINATE ER 25: 25 | 30 days supply | Qty: 15 | Fill #2

## 2019-02-02 MED FILL — SILDENAFIL CITRATE 100 MG T: 100 | 30 days supply | Qty: 10 | Fill #2

## 2019-02-02 MED FILL — LISINOPRIL 5 MG TABLET: 5 | 30 days supply | Qty: 30 | Fill #2

## 2019-02-02 MED FILL — ?ROSUVASTATIN CALCIUM 10 MG: 10 | 30 days supply | Qty: 30 | Fill #3

## 2019-02-19 ENCOUNTER — Ambulatory Visit: Payer: Self-pay | Admitting: Family Medicine

## 2019-03-05 ENCOUNTER — Other Ambulatory Visit: Payer: Self-pay | Admitting: Cardiology

## 2019-03-05 DIAGNOSIS — I252 Old myocardial infarction: Secondary | ICD-10-CM

## 2019-03-05 DIAGNOSIS — I2510 Atherosclerotic heart disease of native coronary artery without angina pectoris: Secondary | ICD-10-CM

## 2019-03-05 MED FILL — SILDENAFIL CITRATE 100 MG T: 100 | 30 days supply | Qty: 10 | Fill #3

## 2019-03-05 MED FILL — $BRILINTA 60 MG TABLET: 60 | 90 days supply | Qty: 180 | Fill #2

## 2019-03-05 MED FILL — ?ROSUVASTATIN CALCIUM 10 MG: 10 | 30 days supply | Qty: 30 | Fill #4

## 2019-03-05 MED FILL — METOPROLOL SUCCINATE ER 25: 25 | 30 days supply | Qty: 15 | Fill #3

## 2019-03-05 MED FILL — LISINOPRIL 5 MG TABLET: 5 | 30 days supply | Qty: 30 | Fill #3

## 2019-04-05 MED FILL — METOPROLOL SUCCINATE ER 25: 25 | 30 days supply | Qty: 15 | Fill #4

## 2019-04-05 MED FILL — ROSUVASTATIN CALCIUM 10 MG: 10 | 30 days supply | Qty: 30 | Fill #5

## 2019-04-05 MED FILL — LISINOPRIL 5 MG TABLET: 5 | 30 days supply | Qty: 30 | Fill #4

## 2019-04-05 MED FILL — SILDENAFIL CITRATE 100 MG T: 100 | 30 days supply | Qty: 10 | Fill #4

## 2019-05-04 MED FILL — SILDENAFIL CITRATE 100 MG T: 100 | 30 days supply | Qty: 10 | Fill #5

## 2019-05-04 MED FILL — ?METOPROLOL SUCC ER 25MG TA: 25 | 30 days supply | Qty: 15 | Fill #5

## 2019-05-04 MED FILL — LISINOPRIL 5 MG TABLET: 5 | 30 days supply | Qty: 30 | Fill #5

## 2019-05-04 MED FILL — ROSUVASTATIN CALCIUM 10 MG: 10 | 30 days supply | Qty: 30 | Fill #6

## 2019-05-10 ENCOUNTER — Telehealth: Payer: Self-pay

## 2019-05-10 ENCOUNTER — Ambulatory Visit: Payer: Self-pay | Admitting: Cardiology

## 2019-05-10 NOTE — Progress Notes (Signed)
No show

## 2019-05-11 DIAGNOSIS — Z5329 Procedure and treatment not carried out because of patient's decision for other reasons: Secondary | ICD-10-CM | POA: Insufficient documentation

## 2019-05-11 DIAGNOSIS — Z91199 Patient's noncompliance with other medical treatment and regimen due to unspecified reason: Secondary | ICD-10-CM | POA: Insufficient documentation

## 2019-05-19 ENCOUNTER — Other Ambulatory Visit: Payer: Self-pay

## 2019-05-19 ENCOUNTER — Ambulatory Visit: Payer: Self-pay | Admitting: Cardiology

## 2019-05-19 ENCOUNTER — Encounter: Payer: Self-pay | Admitting: Cardiology

## 2019-05-19 VITALS — BP 153/92 | HR 87 | Ht 65.0 in | Wt 147.0 lb

## 2019-05-19 DIAGNOSIS — I10 Essential (primary) hypertension: Secondary | ICD-10-CM

## 2019-05-19 DIAGNOSIS — I2510 Atherosclerotic heart disease of native coronary artery without angina pectoris: Secondary | ICD-10-CM

## 2019-05-19 DIAGNOSIS — I252 Old myocardial infarction: Secondary | ICD-10-CM

## 2019-05-19 DIAGNOSIS — E782 Mixed hyperlipidemia: Secondary | ICD-10-CM

## 2019-05-19 NOTE — Progress Notes (Signed)
Follow up visit  Subjective:   Adam [name], male    DOB: Aug 24, 1965, 54 y.o.   MRN: [id number]   Chief Complaint  Patient presents with  . Coronary Artery Disease  . Follow-up   54 y/o Caucasian male with CAD s/p PPCI for inferior STEMI (09/2017), hypertension, h/o tobacco abuse.  Patient is doing well. He continues to walk 50-70 miles/week without any complaints of chest pain, shortness of breath. Blood pressure is elevated today, which he attributes to "anxiety about wearing masks". H states BP is lower at home. He has resumed Crestor use after PCP informed him about his elevated LDL.   Past Medical History:  Diagnosis Date  . Anxiety   . Asthma   . Carpal tunnel syndrome on both sides   . Coronary artery disease involving native coronary artery of native heart without angina pectoris 11/09/2018  . Head injury, acute, with loss of consciousness ([place])    hit on head with bucket at work- bucket fell.  [name] History of kidney stones   . Hypertension    no- was up a couple times whene he went to Dr , told he doesnt have HTN  . Myocardial infarction ([place])   . Tick bite      Past Surgical History:  Procedure Laterality Date  . CARPAL TUNNEL RELEASE Right 02/03/2013   Procedure: RIGHT CARPAL TUNNEL RELEASE;  Surgeon: Schuyler Amor, MD;  Location: [place];  Service: Orthopedics;  Laterality: Right;  . CORONARY STENT INTERVENTION N/A 10/07/2017   Procedure: CORONARY STENT INTERVENTION;  Surgeon: Nigel Mormon, MD;  Location: [place] [facility];  Service: Cardiovascular;  Laterality: N/A;  . CORONARY/GRAFT ACUTE MI REVASCULARIZATION N/A 10/06/2017   Procedure: Coronary/Graft Acute MI Revascularization;  Surgeon: Nigel Mormon, MD;  Location: [place] [facility];  Service: Cardiovascular;  Laterality: N/A;  . LEFT HEART CATH AND CORONARY ANGIOGRAPHY N/A 10/06/2017   Procedure: LEFT HEART CATH AND CORONARY ANGIOGRAPHY;  Surgeon: Nigel Mormon, MD;   Location: [place] [facility];  Service: Cardiovascular;  Laterality: N/A;  . TEMPORARY PACEMAKER N/A 10/06/2017   Procedure: TEMPORARY PACEMAKER;  Surgeon: Nigel Mormon, MD;  Location: [place] [facility];  Service: Cardiovascular;  Laterality: N/A;  . ULNA OSTEOTOMY Right 02/03/2013   Procedure: RIGHT ULNAR SHORTENING OSTEOTOMY;  Surgeon: Schuyler Amor, MD;  Location: [place];  Service: Orthopedics;  Laterality: Right;  . WISDOM TOOTH EXTRACTION  1999  . WOUND EXPLORATION Left 09/18/2012   Procedure: EXPLORE AND REPAIR AS NEEDED LEFT WRIST;  Surgeon: Schuyler Amor, MD;  Location: [place];  Service: Orthopedics;  Laterality: Left;  . WRIST ARTHROSCOPY Right 02/03/2013   Procedure: RIGHT WRIST ARTHROSCOPY WITH DEBRIDEMENT;  Surgeon: Schuyler Amor, MD;  Location: [place];  Service: Orthopedics;  Laterality: Right;     Social History   Socioeconomic History  . Marital status: Significant Other    Spouse name: Not on file  . Number of children: 2  . Years of education: Not on file  . Highest education level: Not on file  Occupational History  . Not on file  Tobacco Use  . Smoking status: Former Smoker    Packs/day: 1.00    Years: 25.00    Pack years: 25.00    Types: Cigarettes    Quit date: 09/2017    Years since quitting: 1.6  . Smokeless tobacco: Current User  Substance and Sexual Activity  .  Alcohol use: Yes    Comment: maybe 6 beers a month  . Drug use: No  . Sexual activity: Not on file  Other Topics Concern  . Not on file  Social History Narrative  . Not on file   Social Determinants of Health   Financial Resource Strain:   . Difficulty of Paying Living Expenses:   Food Insecurity:   . Worried About Charity fundraiser in the Last Year:   . Arboriculturist in the Last Year:   Transportation Needs:   . Film/video editor (Medical):   [name] Lack of Transportation (Non-Medical):   Physical Activity:   . Days of  Exercise per Week:   . Minutes of Exercise per Session:   Stress:   . Feeling of Stress :   Social Connections:   . Frequency of Communication with Friends and Family:   . Frequency of Social Gatherings with Friends and Family:   . Attends Religious Services:   . Active Member of Clubs or Organizations:   . Attends Archivist Meetings:   [name] Marital Status:   Intimate Partner Violence:   . Fear of Current or Ex-Partner:   . Emotionally Abused:   [name] Physically Abused:   . Sexually Abused:      Family History  Problem Relation Age of Onset  . Hypertension Father   . Cancer Father   . Heart failure Father      Current Outpatient Medications on File Prior to Visit  Medication Sig Dispense Refill  . ALPRAZolam (XANAX) 0.25 MG tablet Take 1 tablet (0.25 mg total) by mouth 2 (two) times daily as needed for anxiety. 30 tablet 0  . aspirin 81 MG chewable tablet Chew 1 tablet (81 mg total) by mouth daily. 90 tablet 3  . BRILINTA 60 MG TABS tablet TAKE 1 TABLET (60 MG TOTAL) BY MOUTH 2 (TWO) TIMES DAILY. 180 tablet 2  . calcium-vitamin D (OSCAL WITH D) 500-200 MG-UNIT tablet Take 1 tablet by mouth.    [name] lisinopril (ZESTRIL) 5 MG tablet Take 1 tablet (5 mg total) by mouth daily. 90 tablet 2  . metoprolol succinate (TOPROL-XL) 25 MG 24 hr tablet TAKE 1/2 TABLET (12.5 MG TOTAL) BY MOUTH DAILY. 15 tablet 2  . Multiple Vitamins-Minerals (MULTIVITAMIN WITH MINERALS) tablet Take 1 tablet by mouth daily.    . nitroGLYCERIN (NITROSTAT) 0.4 MG SL tablet Place 1 tablet (0.4 mg total) under the tongue every 5 (five) minutes as needed for chest pain. 30 tablet 1  . rosuvastatin (CRESTOR) 10 MG tablet TAKE 1 TABLET (10 MG TOTAL) BY MOUTH DAILY. 30 tablet 2  . sildenafil (VIAGRA) 100 MG tablet TAKE 0.5-1 TABLETS (50-100 MG TOTAL) BY MOUTH DAILY AS NEEDED FOR ERECTILE DYSFUNCTION. 10 tablet 11   No current facility-administered medications on file prior to visit.    Cardiovascular  studies:  EKG 05/19/2019: Sinus rhythm 81 bpm. Old inferior infarct.   Echocardiogram 02/17/2018: Left ventricle cavity is normal in size. Mild concentric hypertrophy of the left ventricle. Low normal global wall motion. Visual EF is 50-55%. Normal diastolic filling pattern. No significant valvular abnormality. No evidence of pulmonary hypertension.  Coronary intervention 10/07/2017: LM: Normal LAD: Prox-mid tandem 95% stenoses Successful PTCA and stent placement Synergy DES 3.5 X 38 mm DES LCx: Mild disease RCA: S/p primary PCI for inferior STEMI on 10/06/2017 Successful percutaneous coronary intervention PTCA and stent placement 2.75 X 20 mm Synergy drug-eluting stent dRCA 2.75 X 16 mm  Synergy drug-eluting stent pRCA Mild residual mid and distal RCA/PDA disease  Recent labs: 11/10/2018: Glucose 75, BUN/Cr 11/0.92. EGFR 96. Na/K 139/4.8. Rest of the CMP normal H/H 15/47. MCV 89. Platelets 362 Chol 268, TG 124, HDL 83, LDL 164 TSH 1.7 normal   Review of Systems  Cardiovascular: Negative for chest pain, dyspnea on exertion, leg swelling, palpitations and syncope.  Psychiatric/Behavioral: The patient is nervous/anxious.         Vitals:   05/19/19 1619 05/19/19 1624  BP: (!) 158/98 (!) 153/92  Pulse: 91 87  SpO2: 98%      Body mass index is 24.46 kg/m. Filed Weights   05/19/19 1619  Weight: 147 lb (66.7 kg)     Objective:   Physical Exam  Constitutional: He appears well-developed and well-nourished.  Neck: No JVD present.  Cardiovascular: Normal rate, regular rhythm, normal heart sounds and intact distal pulses.  No murmur heard. Pulmonary/Chest: Effort normal and breath sounds normal. He has no wheezes. He has no rales.  Musculoskeletal:        General: No edema.  Nursing note and vitals reviewed.         Assessment & Recommendations:   54 y/o Caucasian male with CAD s/p PPCI for inferior  STEMI (09/2017), hypertension, h/o tobacco abuse.  CAD: STEMI in 09/2017. Successful primary PCI mid and distal RCA, nonculprit vessel PCI to prox-mid LAD. Completed 1 year DAPT post STEMI. Given multivessel PCI, and lack of bleeding, recommend Aspirin 81 mg and Brilinta 60 mg bid till 09/2020 for ischemia benefits.  Hypertension: BP elevated today. He is reluctant to mae any changes to antihypertensive therapy, and states BP is lower at home.  Hyperlipidemia: LDL 164. He says that he has now restarted using Crestor and does not want to increase the dose today. Recheck lipid panel in 2 months  F/u in 3 months to readdress hypertension and hyperlipidemia   Nigel Mormon, MD [facility] [place] Pager: [phone] Office: [phone] If no answer Cell [phone]

## 2019-05-20 ENCOUNTER — Encounter: Payer: Self-pay | Admitting: Cardiology

## 2019-06-03 MED FILL — SILDENAFIL CITRATE 100 MG T: 100 | 30 days supply | Qty: 10 | Fill #6

## 2019-06-03 MED FILL — ?ROSUVASTATIN CALCIUM 10 MG: 10 | 30 days supply | Qty: 30 | Fill #7

## 2019-06-03 MED FILL — LISINOPRIL 5 MG TABLET: 5 | 30 days supply | Qty: 30 | Fill #6

## 2019-06-03 MED FILL — ?METOPROLOL SUCC ER 25MG TA: 25 | 30 days supply | Qty: 15 | Fill #6

## 2019-06-03 MED FILL — $BRILINTA 60 MG TABLET: 60 | 60 days supply | Qty: 120 | Fill #3

## 2019-06-30 MED FILL — METOPROLOL SUCCINATE ER 25: 25 | 30 days supply | Qty: 15 | Fill #7

## 2019-06-30 MED FILL — SILDENAFIL CITRATE 100 MG T: 100 | 30 days supply | Qty: 10 | Fill #7

## 2019-06-30 MED FILL — ROSUVASTATIN CALCIUM 10 MG: 10 | 30 days supply | Qty: 30 | Fill #8

## 2019-06-30 MED FILL — LISINOPRIL 5 MG TABLET: 5 | 30 days supply | Qty: 30 | Fill #7

## 2019-08-06 MED FILL — ROSUVASTATIN CALCIUM 10 MG: 10 | 30 days supply | Qty: 30 | Fill #0

## 2019-08-06 MED FILL — SILDENAFIL CITRATE 100 MG T: 100 | 30 days supply | Qty: 10 | Fill #8

## 2019-08-06 MED FILL — METOPROLOL SUCCINATE ER 25: 25 | 30 days supply | Qty: 15 | Fill #8

## 2019-08-06 MED FILL — LISINOPRIL 5 MG TABLET: 5 | 30 days supply | Qty: 30 | Fill #8

## 2019-08-19 ENCOUNTER — Ambulatory Visit: Payer: Self-pay | Admitting: Cardiology

## 2019-08-19 NOTE — Progress Notes (Signed)
No show

## 2019-09-03 ENCOUNTER — Other Ambulatory Visit: Payer: Self-pay | Admitting: Cardiology

## 2019-09-03 DIAGNOSIS — I2510 Atherosclerotic heart disease of native coronary artery without angina pectoris: Secondary | ICD-10-CM

## 2019-09-03 DIAGNOSIS — I252 Old myocardial infarction: Secondary | ICD-10-CM

## 2019-09-03 MED FILL — METOPROLOL SUCCINATE ER 25: 25 | 30 days supply | Qty: 15 | Fill #9

## 2019-09-03 MED FILL — ROSUVASTATIN CALCIUM 10 MG: 10 | 30 days supply | Qty: 30 | Fill #1

## 2019-09-03 MED FILL — SILDENAFIL CITRATE 100 MG T: 100 | 30 days supply | Qty: 10 | Fill #9

## 2019-09-03 MED FILL — BRILINTA 60 MG TABLET: 60 | 30 days supply | Qty: 60 | Fill #0

## 2019-09-03 MED FILL — LISINOPRIL 5 MG TABLET: 5 | 30 days supply | Qty: 30 | Fill #0

## 2019-09-05 IMAGING — DX DG CHEST 1V
1 series · 1 of 1 positions shown · non-contrast
Comparison: None.

CLINICAL DATA: Elevated white blood cell count

EXAM:
CHEST  1 VIEW

[chest]
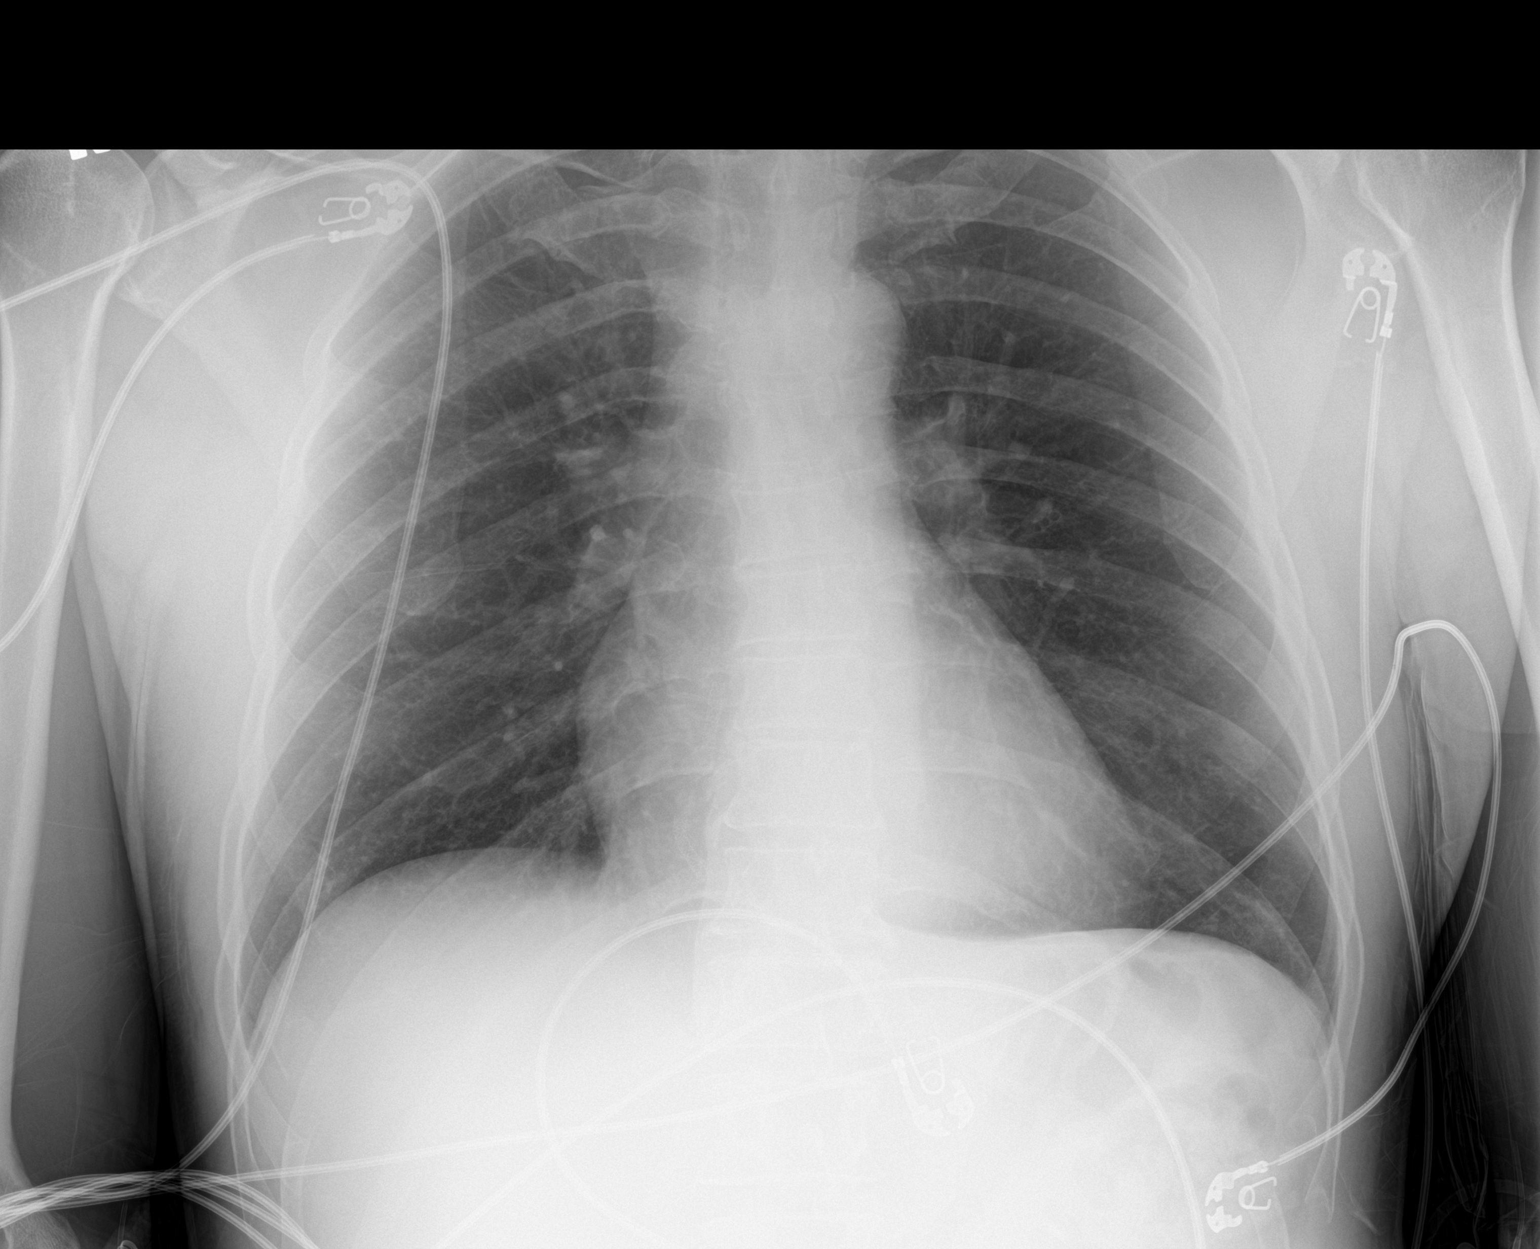

[1 of 1 positions shown; findings below may reference images not displayed]

FINDINGS: The lungs are mildly hyperinflated and clear. The heart and
pulmonary vascularity are normal. The mediastinum is normal in
width. There is no pleural effusion. The bony thorax exhibits no
acute abnormality.
IMPRESSION: There is no active cardiopulmonary disease.

## 2019-10-06 MED FILL — LISINOPRIL 5 MG TABLET: 5 | 30 days supply | Qty: 30 | Fill #1

## 2019-10-06 MED FILL — METOPROLOL SUCCINATE ER 25: 25 | 30 days supply | Qty: 15 | Fill #10

## 2019-10-06 MED FILL — $BRILINTA 60 MG TABLET: 60 | 30 days supply | Qty: 60 | Fill #1

## 2019-10-06 MED FILL — SILDENAFIL CITRATE 100 MG T: 100 | 30 days supply | Qty: 10 | Fill #10

## 2019-10-06 MED FILL — ROSUVASTATIN CALCIUM 10 MG: 10 | 30 days supply | Qty: 30 | Fill #2

## 2019-10-14 ENCOUNTER — Other Ambulatory Visit: Payer: Self-pay

## 2019-10-14 ENCOUNTER — Ambulatory Visit (HOSPITAL_COMMUNITY)
Admission: EM | Admit: 2019-10-14 | Discharge: 2019-10-14 | Disposition: A | Discharge status: Home / Self Care | Payer: Self-pay | Attending: Family Medicine | Admitting: Family Medicine

## 2019-10-14 ENCOUNTER — Encounter (HOSPITAL_COMMUNITY): Payer: Self-pay | Admitting: *Deleted

## 2019-10-14 DIAGNOSIS — L03213 Periorbital cellulitis: Secondary | ICD-10-CM

## 2019-10-14 MED ORDER — AMOXICILLIN-POT CLAVULANATE 875-125 MG PO TABS: 1.0000 | ORAL_TABLET | Freq: Two times a day (BID) | ORAL | 0 refills | Status: DC

## 2019-10-14 NOTE — Discharge Instructions (Signed)
Warm compresses  Take the antibiotic as prescribed Follow up as needed for continued or worsening symptoms

## 2019-10-14 NOTE — ED Triage Notes (Addendum)
Past 2 days patient has experienced swelling and pain to the left eye. For the past 2 months eye has been irritated as if something was in the eye. This morning eye was crusted over and was not able to open eye. Patient reports clear drainage from the eye. Patient rates pain at 4. Swelling left upper eyelid and redness noted. Patient states that that when he raises his upper eyelid he notices a pimple. Patient feels like a pencil is in the corner of eye and complains of facial swelling to the left side after lying on that side.

## 2019-10-15 NOTE — ED Provider Notes (Signed)
[place]    CSN: [id number] Arrival date & time: 10/14/19  1134      History   Chief Complaint Chief Complaint  Patient presents with  . Eye Pain  . Facial Swelling    left eye    HPI Adam Adam [name] is Adam [name] 54 y.o. male.   Patient is Adam [name] 54 year old male presents today with proximally 2 days of worsening left eye swelling.  Reporting some crust in eye this morning.  He has been wiping with rag and use warm compress.  Noticed stye to left upper lid.  No trouble with vision.  No fevers.     Past Medical History:  Diagnosis Date  . Anxiety   . Asthma   . Carpal tunnel syndrome on both sides   . Coronary artery disease involving native coronary artery of native heart without angina pectoris 11/09/2018  . Head injury, acute, with loss of consciousness ([place])    hit on head with bucket at work- bucket fell.  [name] History of kidney stones   . Hypertension    no- was up Adam [name] couple times whene he went to Dr , told he doesnt have HTN  . Myocardial infarction ([place])   . Tick bite     Patient Active Problem List   Diagnosis Date Noted  . No-show for appointment 05/11/2019  . Essential hypertension 11/10/2018  . Panic attacks 11/10/2018  . History of myocardial infarction 11/09/2018  . Coronary artery disease involving native coronary artery of native heart without angina pectoris 11/09/2018  . Tick bite 05/19/2018  . Skin infection 05/19/2018  . Anxiety 05/19/2018  . Tobacco abuse 10/08/2017  . Leukocytosis 10/08/2017  . Mixed hyperlipidemia 10/08/2017  . STEMI (ST elevation myocardial infarction) ([place]) 10/06/2017    Past Surgical History:  Procedure Laterality Date  . CARPAL TUNNEL RELEASE Right 02/03/2013   Procedure: RIGHT CARPAL TUNNEL RELEASE;  Surgeon: Schuyler Amor, MD;  Location: [place];  Service: Orthopedics;  Laterality: Right;  . CORONARY STENT INTERVENTION N/Adam [name] 10/07/2017   Procedure: CORONARY STENT INTERVENTION;  Surgeon:  Nigel Mormon, MD;  Location: [place] [facility];  Service: Cardiovascular;  Laterality: N/Adam [name];  . CORONARY/GRAFT ACUTE MI REVASCULARIZATION N/Adam [name] 10/06/2017   Procedure: Coronary/Graft Acute MI Revascularization;  Surgeon: Nigel Mormon, MD;  Location: [place] [facility];  Service: Cardiovascular;  Laterality: N/Adam [name];  . LEFT HEART CATH AND CORONARY ANGIOGRAPHY N/Adam [name] 10/06/2017   Procedure: LEFT HEART CATH AND CORONARY ANGIOGRAPHY;  Surgeon: Nigel Mormon, MD;  Location: [place] [facility];  Service: Cardiovascular;  Laterality: N/Adam [name];  . TEMPORARY PACEMAKER N/Adam [name] 10/06/2017   Procedure: TEMPORARY PACEMAKER;  Surgeon: Nigel Mormon, MD;  Location: [place] [facility];  Service: Cardiovascular;  Laterality: N/Adam [name];  . ULNA OSTEOTOMY Right 02/03/2013   Procedure: RIGHT ULNAR SHORTENING OSTEOTOMY;  Surgeon: Schuyler Amor, MD;  Location: [place];  Service: Orthopedics;  Laterality: Right;  . WISDOM TOOTH EXTRACTION  1999  . WOUND EXPLORATION Left 09/18/2012   Procedure: EXPLORE AND REPAIR AS NEEDED LEFT WRIST;  Surgeon: Schuyler Amor, MD;  Location: [employer];  Service: Orthopedics;  Laterality: Left;  . WRIST ARTHROSCOPY Right 02/03/2013   Procedure: RIGHT WRIST ARTHROSCOPY WITH DEBRIDEMENT;  Surgeon: Schuyler Amor, MD;  Location: [place];  Service: Orthopedics;  Laterality: Right;       Home Medications    Prior to Admission medications   Medication Sig Start Date End Date  Taking? Authorizing Provider  aspirin 81 MG chewable tablet Chew 1 tablet (81 mg total) by mouth daily. 10/09/17  Yes Patwardhan, Manish J, MD  BRILINTA 60 MG TABS tablet TAKE 1 TABLET (60 MG TOTAL) BY MOUTH 2 (TWO) TIMES DAILY. 03/05/19  Yes Patwardhan, Reynold Bowen, MD  calcium-vitamin D (OSCAL WITH D) 500-200 MG-UNIT tablet Take 1 tablet by mouth.   Yes [provider]  lisinopril (ZESTRIL) 5 MG tablet TAKE 1 TABLET (5 MG TOTAL) BY MOUTH DAILY. 09/03/19  Yes Patwardhan,  Manish J, MD  metoprolol succinate (TOPROL-XL) 25 MG 24 hr tablet TAKE 1/2 TABLET (12.5 MG TOTAL) BY MOUTH DAILY. 03/05/19  Yes Patwardhan, Reynold Bowen, MD  Multiple Vitamins-Minerals (MULTIVITAMIN WITH MINERALS) tablet Take 1 tablet by mouth daily.   Yes [provider]  rosuvastatin (CRESTOR) 10 MG tablet TAKE 1 TABLET (10 MG TOTAL) BY MOUTH DAILY. 03/05/19  Yes Patwardhan, Manish J, MD  sildenafil (VIAGRA) 100 MG tablet TAKE 0.5-1 TABLETS (50-100 MG TOTAL) BY MOUTH DAILY AS NEEDED FOR ERECTILE DYSFUNCTION. 12/07/18  Yes Azzie Glatter, FNP  ALPRAZolam Duanne Moron) 0.25 MG tablet Take 1 tablet (0.25 mg total) by mouth 2 (two) times daily as needed for anxiety. 11/10/18   Azzie Glatter, FNP  amoxicillin-clavulanate (AUGMENTIN) 875-125 MG tablet Take 1 tablet by mouth every 12 (twelve) hours. 10/14/19   Adam Halt A, NP  nitroGLYCERIN (NITROSTAT) 0.4 MG SL tablet Place 1 tablet (0.4 mg total) under the tongue every 5 (five) minutes as needed for chest pain. 11/11/18   Patwardhan, Reynold Bowen, MD    Family History Family History  Problem Relation Age of Onset  . Hypertension Father   . Cancer Father   . Heart failure Father     Social History Social History   Tobacco Use  . Smoking status: Former Smoker    Packs/day: 1.00    Years: 25.00    Pack years: 25.00    Types: Cigarettes    Quit date: 09/2017    Years since quitting: 2.0  . Smokeless tobacco: Former Network engineer  . Vaping Use: Never used  Substance Use Topics  . Alcohol use: Yes    Alcohol/week: 3.0 standard drinks    Types: 3 Cans of beer per week    Comment: maybe 6 beers Adam [name] month  . Drug use: No     Allergies   Patient has no known allergies.   Review of Systems Review of Systems   Physical Exam Triage Vital Signs ED Triage Vitals  Enc Vitals Group     BP 10/14/19 1229 (!) 143/88     Pulse Rate 10/14/19 1229 80     Resp 10/14/19 1229 20     Temp 10/14/19 1229 99 F (37.2 C)     Temp Source  10/14/19 1229 Oral     SpO2 10/14/19 1229 100 %     Weight --      Height --      Head Circumference --      Peak Flow --      Pain Score 10/14/19 1228 4     Pain Loc --      Pain Edu? --      Excl. in [place]? --    No data found.  Updated Vital Signs BP (!) 143/88 (BP Location: Left Arm)   Pulse 80   Temp 99 F (37.2 C) (Oral)   Resp 20   SpO2 100%   Visual Acuity Right Eye  Distance: 20/50 Left Eye Distance: 20/50 Bilateral Distance: 20/30  Right Eye Near:   Left Eye Near:    Bilateral Near:     Physical Exam Vitals and nursing note reviewed.  Constitutional:      Appearance: Normal appearance.  HENT:     Head: Normocephalic and atraumatic.     Nose: Nose normal.  Eyes:     General:        Left eye: Hordeolum present.    Extraocular Movements: Extraocular movements intact.     Conjunctiva/sclera: Conjunctivae normal.     Pupils: Pupils are equal, round, and reactive to light.     Comments: Periorbital erythema and swelling to the left eye. Stye noted to left upper lid. Tender to palpation more to upper lid.   Pulmonary:     Effort: Pulmonary effort is normal.  Musculoskeletal:        General: Normal range of motion.     Cervical back: Normal range of motion.  Skin:    General: Skin is warm and dry.  Neurological:     Mental Status: He is alert.  Psychiatric:        Mood and Affect: Mood normal.      UC Treatments / Results  Labs (all labs ordered are listed, but only abnormal results are displayed) Labs Reviewed - No data to display  EKG   Radiology No results found.  Procedures Procedures (including critical care time)  Medications Ordered in UC Medications - No data to display  Initial Impression / Assessment and Plan / UC Course  I have reviewed the triage vital signs and the nursing notes.  Pertinent labs & imaging results that were available during my care of the patient were reviewed by me and considered in my medical decision making  (see chart for details).     Preseptal cellulitis Warm compresses abx as prescribed Strict return precautions given.  Follow up as needed for continued or worsening symptoms    Final Clinical Impressions(s) / UC Diagnoses   Final diagnoses:  Preseptal cellulitis of left eye     Discharge Instructions     Warm compresses  Take the antibiotic as prescribed Follow up as needed for continued or worsening symptoms     ED Prescriptions    Medication Sig Dispense Auth. Provider   amoxicillin-clavulanate (AUGMENTIN) 875-125 MG tablet Take 1 tablet by mouth every 12 (twelve) hours. 14 tablet Ellena Kamen A, NP     PDMP not reviewed this encounter.   Adam Halt A, NP 10/15/19 1640

## 2019-11-03 ENCOUNTER — Other Ambulatory Visit: Payer: Self-pay | Admitting: Cardiology

## 2019-11-03 DIAGNOSIS — I2510 Atherosclerotic heart disease of native coronary artery without angina pectoris: Secondary | ICD-10-CM

## 2019-11-03 DIAGNOSIS — I252 Old myocardial infarction: Secondary | ICD-10-CM

## 2019-11-03 MED FILL — METOPROLOL SUCCINATE ER 25: 25 | 30 days supply | Qty: 15 | Fill #11

## 2019-11-03 MED FILL — $BRILINTA 60 MG TABLET: 60 | 60 days supply | Qty: 120 | Fill #2

## 2019-11-03 MED FILL — LISINOPRIL 5 MG TABLET: 5 | 30 days supply | Qty: 30 | Fill #2

## 2019-11-03 MED FILL — SILDENAFIL CITRATE 100 MG T: 100 | 30 days supply | Qty: 10 | Fill #11

## 2019-11-03 MED FILL — ROSUVASTATIN CALCIUM 10 MG: 10 | 30 days supply | Qty: 30 | Fill #0

## 2019-12-07 ENCOUNTER — Other Ambulatory Visit: Payer: Self-pay | Admitting: Family Medicine

## 2019-12-07 ENCOUNTER — Other Ambulatory Visit: Payer: Self-pay | Admitting: Cardiology

## 2019-12-07 DIAGNOSIS — I252 Old myocardial infarction: Secondary | ICD-10-CM

## 2019-12-07 DIAGNOSIS — N529 Male erectile dysfunction, unspecified: Secondary | ICD-10-CM

## 2019-12-07 DIAGNOSIS — I2510 Atherosclerotic heart disease of native coronary artery without angina pectoris: Secondary | ICD-10-CM

## 2019-12-07 MED FILL — ROSUVASTATIN CALCIUM 10 MG: 10 | 30 days supply | Qty: 30 | Fill #1

## 2019-12-07 MED FILL — METOPROLOL SUCCINATE ER 25: 25 | 30 days supply | Qty: 15 | Fill #0

## 2019-12-08 ENCOUNTER — Other Ambulatory Visit: Payer: Self-pay | Admitting: Cardiology

## 2019-12-08 MED FILL — LISINOPRIL 5 MG TABLET: 5 | 30 days supply | Qty: 30 | Fill #0

## 2019-12-25 ENCOUNTER — Ambulatory Visit (HOSPITAL_COMMUNITY)
Admission: EM | Admit: 2019-12-25 | Discharge: 2019-12-25 | Disposition: A | Discharge status: Home / Self Care | Payer: Self-pay | Attending: Emergency Medicine | Admitting: Emergency Medicine

## 2019-12-25 ENCOUNTER — Other Ambulatory Visit: Payer: Self-pay

## 2019-12-25 ENCOUNTER — Encounter (HOSPITAL_COMMUNITY): Payer: Self-pay | Admitting: *Deleted

## 2019-12-25 DIAGNOSIS — H60391 Other infective otitis externa, right ear: Secondary | ICD-10-CM

## 2019-12-25 MED ORDER — NEOMYCIN-POLYMYXIN-HC 3.5-10000-1 OT SUSP: 4.0000 [drp] | Freq: Three times a day (TID) | OTIC | 0 refills | Status: DC

## 2019-12-25 MED ORDER — AMOXICILLIN-POT CLAVULANATE 875-125 MG PO TABS: 1.0000 | ORAL_TABLET | Freq: Two times a day (BID) | ORAL | 0 refills | Status: AC

## 2019-12-25 NOTE — ED Provider Notes (Signed)
[place]    CSN: [id number] Arrival date & time: 12/25/19  1128      History   Chief Complaint Chief Complaint  Patient presents with   Otalgia    LT/RT    HPI DAJION BICKFORD is a 54 y.o. male history of CAD, hypertension, presenting today for evaluation of ear pain.  The patient reports over the past 12 days he has had pain and swelling around his right ear.  Symptoms are slightly beginning to develop on the left side.  He reports discomfort with eating occasionally with moving his jaw, but most of the pain is felt with in his ear.  Reports some drainage and odor.  Denies recent URI symptoms.  Denies fevers.  Denies neck stiffness.  HPI  Past Medical History:  Diagnosis Date   Anxiety    Asthma    Carpal tunnel syndrome on both sides    Coronary artery disease involving native coronary artery of native heart without angina pectoris 11/09/2018   Head injury, acute, with loss of consciousness ([place])    hit on head with bucket at work- bucket fell.   History of kidney stones    Hypertension    no- was up a couple times whene he went to Dr , told he doesnt have HTN   Myocardial infarction [facility])    Tick bite     Patient Active Problem List   Diagnosis Date Noted   No-show for appointment 05/11/2019   Essential hypertension 11/10/2018   Panic attacks 11/10/2018   History of myocardial infarction 11/09/2018   Coronary artery disease involving native coronary artery of native heart without angina pectoris 11/09/2018   Tick bite 05/19/2018   Skin infection 05/19/2018   Anxiety 05/19/2018   Tobacco abuse 10/08/2017   Leukocytosis 10/08/2017   Mixed hyperlipidemia 10/08/2017   STEMI (ST elevation myocardial infarction) ([place]) 10/06/2017    Past Surgical History:  Procedure Laterality Date   CARPAL TUNNEL RELEASE Right 02/03/2013   Procedure: RIGHT CARPAL TUNNEL RELEASE;  Surgeon: Schuyler Amor, MD;  Location: [place];  Service: Orthopedics;  Laterality: Right;   CORONARY STENT INTERVENTION N/A 10/07/2017   Procedure: CORONARY STENT INTERVENTION;  Surgeon: Nigel Mormon, MD;  Location: [place] [facility];  Service: Cardiovascular;  Laterality: N/A;   CORONARY/GRAFT ACUTE MI REVASCULARIZATION N/A 10/06/2017   Procedure: Coronary/Graft Acute MI Revascularization;  Surgeon: Nigel Mormon, MD;  Location: [place] [facility];  Service: Cardiovascular;  Laterality: N/A;   LEFT HEART CATH AND CORONARY ANGIOGRAPHY N/A 10/06/2017   Procedure: LEFT HEART CATH AND CORONARY ANGIOGRAPHY;  Surgeon: Nigel Mormon, MD;  Location: [place] [facility];  Service: Cardiovascular;  Laterality: N/A;   TEMPORARY PACEMAKER N/A 10/06/2017   Procedure: TEMPORARY PACEMAKER;  Surgeon: Nigel Mormon, MD;  Location: [place] [facility];  Service: Cardiovascular;  Laterality: N/A;   ULNA OSTEOTOMY Right 02/03/2013   Procedure: RIGHT ULNAR SHORTENING OSTEOTOMY;  Surgeon: Schuyler Amor, MD;  Location: [place];  Service: Orthopedics;  Laterality: Right;   WISDOM TOOTH EXTRACTION  1999   WOUND EXPLORATION Left 09/18/2012   Procedure: EXPLORE AND REPAIR AS NEEDED LEFT WRIST;  Surgeon: Schuyler Amor, MD;  Location: [place];  Service: Orthopedics;  Laterality: Left;   WRIST ARTHROSCOPY Right 02/03/2013   Procedure: RIGHT WRIST ARTHROSCOPY WITH DEBRIDEMENT;  Surgeon: Schuyler Amor, MD;  Location: [place];  Service: Orthopedics;  Laterality: Right;  Home Medications    Prior to Admission medications   Medication Sig Start Date End Date Taking? Authorizing Provider  aspirin 81 MG chewable tablet Chew 1 tablet (81 mg total) by mouth daily. 10/09/17  Yes Patwardhan, Manish J, MD  BRILINTA 60 MG TABS tablet TAKE 1 TABLET (60 MG TOTAL) BY MOUTH 2 (TWO) TIMES DAILY. 03/05/19  Yes Patwardhan, Reynold Bowen, MD  calcium-vitamin D (OSCAL WITH D) 500-200 MG-UNIT tablet Take 1  tablet by mouth.   Yes [provider]  lisinopril (ZESTRIL) 5 MG tablet TAKE 1 TABLET (5 MG TOTAL) BY MOUTH DAILY. 12/08/19  Yes Patwardhan, Manish J, MD  metoprolol succinate (TOPROL-XL) 25 MG 24 hr tablet TAKE 1/2 TABLET (12.5 MG TOTAL) BY MOUTH DAILY. 03/05/19  Yes Patwardhan, Reynold Bowen, MD  Multiple Vitamins-Minerals (MULTIVITAMIN WITH MINERALS) tablet Take 1 tablet by mouth daily.   Yes [provider]  nitroGLYCERIN (NITROSTAT) 0.4 MG SL tablet Place 1 tablet (0.4 mg total) under the tongue every 5 (five) minutes as needed for chest pain. 11/11/18  Yes Patwardhan, Manish J, MD  rosuvastatin (CRESTOR) 10 MG tablet TAKE 1 TABLET (10 MG TOTAL) BY MOUTH DAILY. 11/03/19  Yes Patwardhan, Reynold Bowen, MD  ALPRAZolam (XANAX) 0.25 MG tablet Take 1 tablet (0.25 mg total) by mouth 2 (two) times daily as needed for anxiety. 11/10/18   Azzie Glatter, FNP  amoxicillin-clavulanate (AUGMENTIN) 875-125 MG tablet Take 1 tablet by mouth every 12 (twelve) hours for 10 days. 12/25/19 01/04/20  Lenell Lama C, PA-C  neomycin-polymyxin-hydrocortisone (CORTISPORIN) 3.5-10000-1 OTIC suspension Place 4 drops into the right ear 3 (three) times daily. 12/25/19   Jonathyn Carothers C, PA-C  sildenafil (VIAGRA) 100 MG tablet TAKE 0.5-1 TABLETS (50-100 MG TOTAL) BY MOUTH DAILY AS NEEDED FOR ERECTILE DYSFUNCTION. 12/07/18   Azzie Glatter, FNP    Family History Family History  Problem Relation Age of Onset   Hypertension Father    Cancer Father    Heart failure Father     Social History Social History   Tobacco Use   Smoking status: Former Smoker    Packs/day: 1.00    Years: 25.00    Pack years: 25.00    Types: Cigarettes    Quit date: 09/2017    Years since quitting: 2.2   Smokeless tobacco: Former Counsellor Use: Never used  Substance Use Topics   Alcohol use: Yes    Alcohol/week: 3.0 standard drinks    Types: 3 Cans of beer per week    Comment: maybe 6 beers a  month   Drug use: No     Allergies   Patient has no known allergies.   Review of Systems Review of Systems  Constitutional: Negative for activity change, appetite change, chills, fatigue and fever.  HENT: Positive for ear pain. Negative for congestion, rhinorrhea, sinus pressure, sore throat and trouble swallowing.   Eyes: Negative for discharge and redness.  Respiratory: Negative for cough, chest tightness and shortness of breath.   Cardiovascular: Negative for chest pain.  Gastrointestinal: Negative for abdominal pain, diarrhea, nausea and vomiting.  Musculoskeletal: Negative for myalgias.  Skin: Negative for rash.  Neurological: Negative for dizziness, light-headedness and headaches.     Physical Exam Triage Vital Signs ED Triage Vitals [12/25/19 1238]  Enc Vitals Group     BP      Pulse      Resp      Temp      Temp src  SpO2      Weight 150 lb (68 kg)     Height 5\' 4"  (1.626 m)     Head Circumference      Peak Flow      Pain Score 4     Pain Loc      Pain Edu?      Excl. in [place]?    No data found.  Updated Vital Signs BP 131/79 (BP Location: Left Arm)    Pulse 81    Temp 98.9 F (37.2 C) (Tympanic)    Resp 16    Ht 5\' 4"  (1.626 m)    Wt 150 lb (68 kg)    SpO2 99%    BMI 25.75 kg/m   Visual Acuity Right Eye Distance:   Left Eye Distance:   Bilateral Distance:    Right Eye Near:   Left Eye Near:    Bilateral Near:     Physical Exam Vitals and nursing note reviewed.  Constitutional:      Appearance: He is well-developed.     Comments: No acute distress  HENT:     Head: Normocephalic and atraumatic.     Ears:     Comments: Right external auricle tender to palpation, canal edematous erythematous with discharge noted, TM partially visualized and appears intact and appears slightly opaque  Left TM with small amount of cerumen present, canal without swelling, TM with good bony landmarks    Nose: Nose normal.     Mouth/Throat:     Comments: Oral  mucosa pink and moist, no tonsillar enlargement or exudate. Posterior pharynx patent and nonerythematous, no uvula deviation or swelling. Normal phonation.  No soft palate swelling Eyes:     Conjunctiva/sclera: Conjunctivae normal.  Neck:     Comments: Right-sided preauricular and postauricular lymphadenopathy  Full active range of motion of neck Cardiovascular:     Rate and Rhythm: Normal rate.  Pulmonary:     Effort: Pulmonary effort is normal. No respiratory distress.  Abdominal:     General: There is no distension.  Musculoskeletal:        General: Normal range of motion.     Cervical back: Neck supple.  Skin:    General: Skin is warm and dry.  Neurological:     Mental Status: He is alert and oriented to person, place, and time.      UC Treatments / Results  Labs (all labs ordered are listed, but only abnormal results are displayed) Labs Reviewed - No data to display  EKG   Radiology No results found.  Procedures Procedures (including critical care time)  Medications Ordered in UC Medications - No data to display  Initial Impression / Assessment and Plan / UC Course  I have reviewed the triage vital signs and the nursing notes.  Pertinent labs & imaging results that were available during my care of the patient were reviewed by me and considered in my medical decision making (see chart for details).     Patient appears to have right otitis externa, with associated periauricular lymphadenopathy, no tonsillar or soft palate swelling noted on oral exam.  Low suspicion of deep space abscess.  Initiating on Augmentin x10 days along with Cortisporin topically.  Anti-inflammatories for pain.  Monitor for gradual resolution.   Discussed strict return precautions. Patient verbalized understanding and is agreeable with plan.  Final Clinical Impressions(s) / UC Diagnoses   Final diagnoses:  Other infective acute otitis externa of right ear     Discharge  Instructions     Begin Augmentin twice daily x 10 days Cortisporin drops 4 drops 3 times daily Use anti-inflammatories for pain/swelling. You may take up to 800 mg Ibuprofen every 8 hours with food. You may supplement Ibuprofen with Tylenol [phone] mg every 8 hours.  Keep ear dry Warm compresses to swollen lymphnodes Follow up if not improving or worsening    ED Prescriptions    Medication Sig Dispense Auth. Provider   amoxicillin-clavulanate (AUGMENTIN) 875-125 MG tablet Take 1 tablet by mouth every 12 (twelve) hours for 10 days. 20 tablet Ned Kakar C, PA-C   neomycin-polymyxin-hydrocortisone (CORTISPORIN) 3.5-10000-1 OTIC suspension Place 4 drops into the right ear 3 (three) times daily. 10 mL Joaquin Knebel, [place] C, PA-C     PDMP not reviewed this encounter.   Janith Lima, PA-C 12/25/19 1316

## 2019-12-25 NOTE — Discharge Instructions (Signed)
Begin Augmentin twice daily x 10 days Cortisporin drops 4 drops 3 times daily Use anti-inflammatories for pain/swelling. You may take up to 800 mg Ibuprofen every 8 hours with food. You may supplement Ibuprofen with Tylenol [phone] mg every 8 hours.  Keep ear dry Warm compresses to swollen lymphnodes Follow up if not improving or worsening

## 2019-12-25 NOTE — ED Triage Notes (Signed)
PT reports bil ear pain with odor.

## 2020-01-06 ENCOUNTER — Other Ambulatory Visit: Payer: Self-pay | Admitting: Cardiology

## 2020-01-06 DIAGNOSIS — I252 Old myocardial infarction: Secondary | ICD-10-CM

## 2020-01-06 DIAGNOSIS — I2510 Atherosclerotic heart disease of native coronary artery without angina pectoris: Secondary | ICD-10-CM

## 2020-01-06 MED FILL — METOPROLOL SUCCINATE ER 25: 25 | 30 days supply | Qty: 15 | Fill #1

## 2020-01-06 MED FILL — LISINOPRIL 5 MG TABLET: 5 | 30 days supply | Qty: 30 | Fill #1

## 2020-01-06 MED FILL — ROSUVASTATIN CALCIUM 10 MG: 10 | 30 days supply | Qty: 30 | Fill #0

## 2020-01-07 MED FILL — BRILINTA 60 MG TABLET: 60 | 30 days supply | Qty: 60 | Fill #3

## 2020-01-07 NOTE — Telephone Encounter (Signed)
Please advised on medication refill.

## 2020-02-03 ENCOUNTER — Other Ambulatory Visit: Payer: Self-pay | Admitting: Family Medicine

## 2020-02-03 DIAGNOSIS — N529 Male erectile dysfunction, unspecified: Secondary | ICD-10-CM

## 2020-02-03 MED FILL — METOPROLOL SUCCINATE ER 25: 25 | 30 days supply | Qty: 15 | Fill #2

## 2020-02-03 MED FILL — ROSUVASTATIN CALCIUM 10 MG: 10 | 30 days supply | Qty: 30 | Fill #1

## 2020-02-03 MED FILL — BRILINTA 60 MG TABLET: 60 | 30 days supply | Qty: 60 | Fill #4

## 2020-02-03 MED FILL — LISINOPRIL 5 MG TABLET: 5 | 30 days supply | Qty: 30 | Fill #2

## 2020-03-01 ENCOUNTER — Other Ambulatory Visit: Payer: Self-pay | Admitting: Cardiology

## 2020-03-01 ENCOUNTER — Other Ambulatory Visit: Payer: Self-pay | Admitting: Family Medicine

## 2020-03-01 DIAGNOSIS — I2510 Atherosclerotic heart disease of native coronary artery without angina pectoris: Secondary | ICD-10-CM

## 2020-03-01 DIAGNOSIS — I252 Old myocardial infarction: Secondary | ICD-10-CM

## 2020-03-01 DIAGNOSIS — N529 Male erectile dysfunction, unspecified: Secondary | ICD-10-CM

## 2020-03-01 MED FILL — ROSUVASTATIN CALCIUM 10 MG: 10 | 30 days supply | Qty: 30 | Fill #0

## 2020-03-01 MED FILL — LISINOPRIL 5 MG TABLET: 5 | 30 days supply | Qty: 30 | Fill #0

## 2020-03-01 MED FILL — METOPROLOL SUCCINATE ER 25: 25 | 30 days supply | Qty: 15 | Fill #0

## 2020-03-01 MED FILL — BRILINTA 60 MG TABLET: 60 | 30 days supply | Qty: 60 | Fill #5

## 2020-03-01 NOTE — Telephone Encounter (Signed)
Please see refill request.

## 2020-04-10 ENCOUNTER — Other Ambulatory Visit: Payer: Self-pay | Admitting: Cardiology

## 2020-04-10 ENCOUNTER — Other Ambulatory Visit: Payer: Self-pay | Admitting: Family Medicine

## 2020-04-10 DIAGNOSIS — I252 Old myocardial infarction: Secondary | ICD-10-CM

## 2020-04-10 DIAGNOSIS — I2510 Atherosclerotic heart disease of native coronary artery without angina pectoris: Secondary | ICD-10-CM

## 2020-04-10 DIAGNOSIS — N529 Male erectile dysfunction, unspecified: Secondary | ICD-10-CM

## 2020-05-10 ENCOUNTER — Other Ambulatory Visit: Payer: Self-pay

## 2020-05-10 ENCOUNTER — Other Ambulatory Visit: Payer: Self-pay | Admitting: Pharmacy Technician

## 2020-05-10 ENCOUNTER — Other Ambulatory Visit: Payer: Self-pay | Admitting: Cardiology

## 2020-05-10 DIAGNOSIS — I2510 Atherosclerotic heart disease of native coronary artery without angina pectoris: Secondary | ICD-10-CM

## 2020-05-10 DIAGNOSIS — I252 Old myocardial infarction: Secondary | ICD-10-CM

## 2020-05-10 MED ORDER — METOPROLOL SUCCINATE ER 25 MG PO TB24
ORAL_TABLET | ORAL | 0 refills | Status: DC
  Filled 2020-05-10: qty 15, 30d supply, fill #0

## 2020-05-10 MED ORDER — LISINOPRIL 5 MG PO TABS
ORAL_TABLET | ORAL | 0 refills | Status: DC
  Filled 2020-05-10: qty 30, 30d supply, fill #0

## 2020-05-10 MED ORDER — ROSUVASTATIN CALCIUM 10 MG PO TABS
ORAL_TABLET | ORAL | 0 refills | Status: DC
  Filled 2020-05-10: qty 30, 30d supply, fill #0

## 2020-05-10 MED ORDER — TICAGRELOR 60 MG PO TABS
ORAL_TABLET | ORAL | 0 refills | Status: DC
  Filled 2020-05-10: qty 30, 15d supply, fill #0

## 2020-05-10 MED FILL — Sildenafil Citrate Tab 100 MG: ORAL | 30 days supply | Qty: 10 | Fill #0 | Status: AC

## 2020-05-11 ENCOUNTER — Other Ambulatory Visit: Payer: Self-pay

## 2020-05-15 ENCOUNTER — Other Ambulatory Visit: Payer: Self-pay

## 2020-06-28 ENCOUNTER — Other Ambulatory Visit: Payer: Self-pay

## 2020-06-28 ENCOUNTER — Other Ambulatory Visit: Payer: Self-pay | Admitting: Cardiology

## 2020-06-28 DIAGNOSIS — I252 Old myocardial infarction: Secondary | ICD-10-CM

## 2020-06-28 DIAGNOSIS — I2510 Atherosclerotic heart disease of native coronary artery without angina pectoris: Secondary | ICD-10-CM

## 2020-06-28 MED ORDER — TICAGRELOR 60 MG PO TABS
ORAL_TABLET | ORAL | 0 refills | Status: DC
  Filled 2020-06-28: qty 60, 30d supply, fill #0

## 2020-06-28 MED ORDER — ROSUVASTATIN CALCIUM 10 MG PO TABS
ORAL_TABLET | ORAL | 0 refills | Status: DC
  Filled 2020-06-28: qty 30, 30d supply, fill #0

## 2020-06-28 MED ORDER — METOPROLOL SUCCINATE ER 25 MG PO TB24
ORAL_TABLET | ORAL | 0 refills | Status: DC
  Filled 2020-06-28: qty 15, 30d supply, fill #0

## 2020-06-28 MED ORDER — LISINOPRIL 5 MG PO TABS
ORAL_TABLET | ORAL | 0 refills | Status: DC
  Filled 2020-06-28: qty 30, 30d supply, fill #0

## 2020-06-28 MED FILL — Sildenafil Citrate Tab 100 MG: ORAL | 30 days supply | Qty: 10 | Fill #1 | Status: AC

## 2020-06-29 ENCOUNTER — Other Ambulatory Visit: Payer: Self-pay

## 2020-07-26 ENCOUNTER — Other Ambulatory Visit: Payer: Self-pay

## 2020-07-26 ENCOUNTER — Other Ambulatory Visit: Payer: Self-pay | Admitting: Family Medicine

## 2020-07-26 DIAGNOSIS — I2510 Atherosclerotic heart disease of native coronary artery without angina pectoris: Secondary | ICD-10-CM

## 2020-07-26 DIAGNOSIS — I252 Old myocardial infarction: Secondary | ICD-10-CM

## 2020-07-26 MED FILL — Sildenafil Citrate Tab 100 MG: ORAL | 30 days supply | Qty: 10 | Fill #2 | Status: AC

## 2020-07-27 ENCOUNTER — Other Ambulatory Visit: Payer: Self-pay

## 2020-07-28 ENCOUNTER — Other Ambulatory Visit: Payer: Self-pay

## 2020-08-04 ENCOUNTER — Other Ambulatory Visit: Payer: Self-pay

## 2020-08-04 DIAGNOSIS — I2510 Atherosclerotic heart disease of native coronary artery without angina pectoris: Secondary | ICD-10-CM

## 2020-08-04 DIAGNOSIS — I252 Old myocardial infarction: Secondary | ICD-10-CM

## 2020-08-04 MED ORDER — ROSUVASTATIN CALCIUM 10 MG PO TABS
ORAL_TABLET | ORAL | 0 refills | Status: DC
  Filled 2020-08-04: qty 30, 30d supply, fill #0

## 2020-08-04 MED ORDER — METOPROLOL SUCCINATE ER 25 MG PO TB24
ORAL_TABLET | ORAL | 0 refills | Status: DC
  Filled 2020-08-04: qty 15, 30d supply, fill #0

## 2020-08-04 MED ORDER — TICAGRELOR 60 MG PO TABS
ORAL_TABLET | ORAL | 0 refills | Status: DC
  Filled 2020-08-04: qty 60, 30d supply, fill #0

## 2020-08-04 MED ORDER — LISINOPRIL 5 MG PO TABS
ORAL_TABLET | ORAL | 0 refills | Status: DC
  Filled 2020-08-04: qty 30, 30d supply, fill #0

## 2020-08-07 ENCOUNTER — Other Ambulatory Visit: Payer: Self-pay

## 2020-08-17 ENCOUNTER — Ambulatory Visit: Payer: Self-pay | Admitting: Student

## 2020-08-17 ENCOUNTER — Encounter: Payer: Self-pay | Admitting: Student

## 2020-08-17 ENCOUNTER — Other Ambulatory Visit: Payer: Self-pay

## 2020-08-17 VITALS — BP 134/90 | HR 70 | Temp 98.0°F | Resp 17 | Ht 64.0 in | Wt 147.0 lb

## 2020-08-17 DIAGNOSIS — Z5329 Procedure and treatment not carried out because of patient's decision for other reasons: Secondary | ICD-10-CM

## 2020-08-18 NOTE — Progress Notes (Signed)
Patient presented for visit but after being roomed by medical assistant he left without being seen by provider.

## 2020-08-21 ENCOUNTER — Other Ambulatory Visit: Payer: Self-pay

## 2020-09-01 ENCOUNTER — Other Ambulatory Visit: Payer: Self-pay

## 2020-09-01 ENCOUNTER — Other Ambulatory Visit: Payer: Self-pay | Admitting: Cardiology

## 2020-09-01 DIAGNOSIS — I252 Old myocardial infarction: Secondary | ICD-10-CM

## 2020-09-01 DIAGNOSIS — I2510 Atherosclerotic heart disease of native coronary artery without angina pectoris: Secondary | ICD-10-CM

## 2020-09-01 MED FILL — Sildenafil Citrate Tab 100 MG: ORAL | 30 days supply | Qty: 10 | Fill #3 | Status: CN

## 2020-09-06 ENCOUNTER — Other Ambulatory Visit: Payer: Self-pay

## 2020-09-08 ENCOUNTER — Other Ambulatory Visit: Payer: Self-pay

## 2020-09-08 NOTE — Telephone Encounter (Signed)
From pt

## 2020-09-21 ENCOUNTER — Telehealth: Payer: Self-pay | Admitting: Cardiology

## 2020-09-21 ENCOUNTER — Other Ambulatory Visit: Payer: Self-pay

## 2020-09-21 DIAGNOSIS — I2510 Atherosclerotic heart disease of native coronary artery without angina pectoris: Secondary | ICD-10-CM

## 2020-09-21 DIAGNOSIS — I252 Old myocardial infarction: Secondary | ICD-10-CM

## 2020-09-21 MED ORDER — METOPROLOL SUCCINATE ER 25 MG PO TB24
ORAL_TABLET | ORAL | 0 refills | Status: DC
  Filled 2020-09-21: qty 15, 30d supply, fill #0

## 2020-09-21 MED ORDER — LISINOPRIL 5 MG PO TABS
ORAL_TABLET | ORAL | 0 refills | Status: DC
  Filled 2020-09-21: qty 30, 30d supply, fill #0

## 2020-09-21 MED ORDER — ROSUVASTATIN CALCIUM 10 MG PO TABS
ORAL_TABLET | ORAL | 0 refills | Status: DC
  Filled 2020-09-21: qty 30, 30d supply, fill #0

## 2020-09-21 NOTE — Telephone Encounter (Signed)
Pt's girlfriend called to request refill for Pt: lisinoprol, metoprolol, rosuvastatin.

## 2020-09-21 NOTE — Telephone Encounter (Signed)
Done medications have been refilled

## 2020-10-03 ENCOUNTER — Ambulatory Visit: Payer: Self-pay | Admitting: Cardiology

## 2020-10-03 ENCOUNTER — Other Ambulatory Visit: Payer: Self-pay | Admitting: Cardiology

## 2020-10-03 ENCOUNTER — Other Ambulatory Visit: Payer: Self-pay

## 2020-10-03 DIAGNOSIS — I2510 Atherosclerotic heart disease of native coronary artery without angina pectoris: Secondary | ICD-10-CM

## 2020-10-03 NOTE — Progress Notes (Signed)
Called this patient for tel encounter. Went to [facility] which is not set up.

## 2020-10-20 ENCOUNTER — Other Ambulatory Visit: Payer: Self-pay

## 2020-10-20 ENCOUNTER — Ambulatory Visit: Payer: Self-pay | Admitting: Cardiology

## 2020-10-20 ENCOUNTER — Encounter: Payer: Self-pay | Admitting: Cardiology

## 2020-10-20 VITALS — BP 126/84 | HR 80 | Temp 98.4°F | Resp 16 | Ht 65.0 in | Wt 145.0 lb

## 2020-10-20 DIAGNOSIS — I252 Old myocardial infarction: Secondary | ICD-10-CM

## 2020-10-20 DIAGNOSIS — E782 Mixed hyperlipidemia: Secondary | ICD-10-CM

## 2020-10-20 DIAGNOSIS — I2510 Atherosclerotic heart disease of native coronary artery without angina pectoris: Secondary | ICD-10-CM

## 2020-10-20 MED ORDER — ASPIRIN 81 MG PO CHEW
81.0000 mg | CHEWABLE_TABLET | Freq: Every day | ORAL | 3 refills | Status: DC
  Filled 2020-10-20: qty 90, 90d supply, fill #0

## 2020-10-20 MED ORDER — LISINOPRIL 5 MG PO TABS
5.0000 mg | ORAL_TABLET | Freq: Every day | ORAL | 3 refills | Status: DC
  Filled 2020-10-20: qty 30, 30d supply, fill #0
  Filled 2020-12-18: qty 30, 30d supply, fill #1
  Filled 2021-02-16: qty 30, 30d supply, fill #0
  Filled 2021-04-04: qty 30, 30d supply, fill #1
  Filled 2021-06-21: qty 30, 30d supply, fill #2
  Filled 2021-08-21: qty 30, 30d supply, fill #3
  Filled 2021-10-19: qty 30, 30d supply, fill #4

## 2020-10-20 MED ORDER — METOPROLOL SUCCINATE ER 25 MG PO TB24
25.0000 mg | ORAL_TABLET | Freq: Every day | ORAL | 3 refills | Status: DC
  Filled 2020-10-20: qty 30, 30d supply, fill #0
  Filled 2020-12-18: qty 30, 30d supply, fill #1
  Filled 2021-02-16: qty 30, 30d supply, fill #0
  Filled 2021-04-04: qty 30, 30d supply, fill #1
  Filled 2021-06-21: qty 30, 30d supply, fill #2
  Filled 2021-08-21: qty 30, 30d supply, fill #3
  Filled 2021-10-19: qty 30, 30d supply, fill #4

## 2020-10-20 MED ORDER — ROSUVASTATIN CALCIUM 10 MG PO TABS
10.0000 mg | ORAL_TABLET | Freq: Every day | ORAL | 3 refills | Status: DC
  Filled 2020-10-20: qty 30, 30d supply, fill #0
  Filled 2020-12-18: qty 30, 30d supply, fill #1
  Filled 2021-02-16: qty 30, 30d supply, fill #0
  Filled 2021-04-04: qty 30, 30d supply, fill #1
  Filled 2021-06-21: qty 30, 30d supply, fill #2
  Filled 2021-08-21: qty 30, 30d supply, fill #3
  Filled 2021-10-19: qty 30, 30d supply, fill #4

## 2020-10-20 NOTE — Progress Notes (Signed)
Follow up visit  Subjective:   Adam [name], male    DOB: 11/02/1965, 55 y.o.   MRN: [id number]   Chief Complaint  Patient presents with   Coronary artery disease involving native coronary artery of   Follow-up   55 y/o Caucasian male with CAD s/p PPCI for inferior STEMI (09/2017), hypertension, h/o tobacco abuse.  Patient is doing well. He walks 35 miles a week without any complaints of chest pain, shortness of breath. Blood pressure overall well controlled.  Current Outpatient Medications on File Prior to Visit  Medication Sig Dispense Refill   ALPRAZolam (XANAX) 0.25 MG tablet Take 1 tablet (0.25 mg total) by mouth 2 (two) times daily as needed for anxiety. 30 tablet 0   aspirin 81 MG chewable tablet Chew 1 tablet (81 mg total) by mouth daily. 90 tablet 3   calcium-vitamin D (OSCAL WITH D) 500-200 MG-UNIT tablet Take 1 tablet by mouth.     lisinopril (ZESTRIL) 5 MG tablet TAKE 1 TABLET (5 MG TOTAL) BY MOUTH DAILY.NEEDS APPT FOR FURTHER REFILLS 30 tablet 0   metoprolol succinate (TOPROL-XL) 25 MG 24 hr tablet TAKE 1/2 TABLET (12.5 MG TOTAL) BY MOUTH DAILY.NEEDS AN APPT FOR FURTHER REFILLS 15 tablet 0   Multiple Vitamins-Minerals (MULTIVITAMIN WITH MINERALS) tablet Take 1 tablet by mouth daily.     nitroGLYCERIN (NITROSTAT) 0.4 MG SL tablet Place 1 tablet (0.4 mg total) under the tongue every 5 (five) minutes as needed for chest pain. 30 tablet 1   rosuvastatin (CRESTOR) 10 MG tablet TAKE 1 TABLET (10 MG TOTAL) BY MOUTH DAILY. NEEDS APPT FOR FURTHER REFILLS 30 tablet 0   sildenafil (VIAGRA) 100 MG tablet TAKE 0.5-1 TABLETS (50-100 MG TOTAL) BY MOUTH DAILY AS NEEDED FOR ERECTILE DYSFUNCTION. 10 tablet 11   No current facility-administered medications on file prior to visit.    Cardiovascular studies:  EKG 10/20/2020: Sinus rhythm 89 bpm Normal EKG   Echocardiogram 02/17/2018: Left ventricle cavity is normal in size. Mild concentric hypertrophy of the left ventricle. Low  normal global wall motion. Visual EF is 50-55%. Normal diastolic filling pattern. No significant valvular abnormality. No evidence of pulmonary hypertension.   Coronary intervention 10/07/2017: LM: Normal LAD: Prox-mid tandem 95% stenoses         Successful PTCA and stent placement         Synergy DES 3.5 X 38 mm DES LCx: Mild disease RCA: S/p primary PCI for inferior STEMI on 10/06/2017         Successful percutaneous coronary intervention          PTCA and stent placement          2.75 X 20 mm Synergy drug-eluting stent dRCA          2.75 X 16 mm Synergy drug-eluting stent pRCA          Mild residual mid and distal RCA/PDA disease  Recent labs: 11/10/2018: Glucose 75, BUN/Cr 11/0.92. EGFR 96. Na/K 139/4.8. Rest of the CMP normal H/H 15/47. MCV 89. Platelets 362 Chol 268, TG 124, HDL 83, LDL 164 TSH 1.7 normal   Review of Systems  Cardiovascular:  Negative for chest pain, dyspnea on exertion, leg swelling, palpitations and syncope.  Psychiatric/Behavioral:  The patient is nervous/anxious.        Vitals:   10/20/20 1106  BP: (!) 150/85  Pulse: 80  Resp: 16  Temp: 98.4 F (36.9 C)  SpO2: 98%     Body mass index is 24.13  kg/m. Filed Weights   10/20/20 1106  Weight: 145 lb (65.8 kg)     Objective:   Physical Exam Vitals and nursing note reviewed.  Constitutional:      Appearance: He is well-developed.  Neck:     Vascular: No JVD.  Cardiovascular:     Rate and Rhythm: Normal rate and regular rhythm.     Pulses: Intact distal pulses.     Heart sounds: Normal heart sounds. No murmur heard. Pulmonary:     Effort: Pulmonary effort is normal.     Breath sounds: Normal breath sounds. No wheezing or rales.          Assessment & Recommendations:   55 y/o Caucasian male with CAD s/p PPCI for inferior STEMI (09/2017), hypertension, h/o tobacco abuse.  CAD: STEMI in 09/2017. Successful primary PCI mid and distal RCA, nonculprit vessel PCI to prox-mid  LAD. Continue Aspirin 81 mg. Continue Crestor 10 mg for now. Check lipid panel and adjust dose as needed. Continue metoprolol succinate 25 mg, lisinopril 5 mg.  Hypertension: Controlled.  Hyperlipidemia: Check lipid panel.  F/u in 1 year  Nigel Mormon, MD [facility] [place] Pager: [phone] Office: [phone] If no answer Cell [phone]

## 2020-10-23 ENCOUNTER — Other Ambulatory Visit: Payer: Self-pay

## 2020-12-18 ENCOUNTER — Other Ambulatory Visit: Payer: Self-pay

## 2020-12-18 MED FILL — Sildenafil Citrate Tab 100 MG: ORAL | 30 days supply | Qty: 10 | Fill #3 | Status: AC

## 2020-12-19 ENCOUNTER — Other Ambulatory Visit: Payer: Self-pay

## 2021-02-16 ENCOUNTER — Other Ambulatory Visit: Payer: Self-pay

## 2021-02-16 MED FILL — Sildenafil Citrate Tab 100 MG: ORAL | 30 days supply | Qty: 10 | Fill #0 | Status: AC

## 2021-02-19 ENCOUNTER — Other Ambulatory Visit: Payer: Self-pay

## 2021-04-03 ENCOUNTER — Encounter (HOSPITAL_COMMUNITY): Payer: Self-pay

## 2021-04-03 ENCOUNTER — Ambulatory Visit (HOSPITAL_COMMUNITY)
Admission: EM | Admit: 2021-04-03 | Discharge: 2021-04-03 | Disposition: A | Discharge status: Home / Self Care | Payer: Self-pay | Attending: Family Medicine | Admitting: Family Medicine

## 2021-04-03 ENCOUNTER — Ambulatory Visit (INDEPENDENT_AMBULATORY_CARE_PROVIDER_SITE_OTHER): Payer: Self-pay

## 2021-04-03 ENCOUNTER — Other Ambulatory Visit: Payer: Self-pay

## 2021-04-03 DIAGNOSIS — M79641 Pain in right hand: Secondary | ICD-10-CM

## 2021-04-03 DIAGNOSIS — M79631 Pain in right forearm: Secondary | ICD-10-CM

## 2021-04-03 MED ORDER — PREDNISONE 20 MG PO TABS: 40.0000 mg | ORAL_TABLET | Freq: Every day | ORAL | 0 refills | Status: DC

## 2021-04-03 MED ORDER — HYDROCODONE-ACETAMINOPHEN 5-325 MG PO TABS: 1.0000 | ORAL_TABLET | Freq: Four times a day (QID) | ORAL | 0 refills | Status: DC | PRN

## 2021-04-03 NOTE — Discharge Instructions (Signed)

## 2021-04-03 NOTE — ED Triage Notes (Signed)
Pt presents with ongoing right arm & hand pain; pt states he had multiple surgeries on that arm and hand. ?

## 2021-04-03 NOTE — ED Provider Notes (Signed)
[name]   [id number] 04/03/21 Arrival Time: 7342  ASSESSMENT & PLAN:  1. Right hand pain   2. Pain of right forearm    I have personally viewed the imaging studies ordered this visit. RIGHT forearm and hand: No acute fractures/changes noted. Old hardware of R forearm appears to be in place.[name]  Discharge Medication List as of 04/03/2021 12:15 PM     START taking these medications   Details  HYDROcodone-acetaminophen (NORCO/VICODIN) 5-325 MG tablet Take 1 tablet by mouth every 6 (six) hours as needed for moderate pain or severe pain., Starting Tue 04/03/2021, Print    predniSONE (DELTASONE) 20 MG tablet Take 2 tablets (40 mg total) by mouth daily., Starting Tue 04/03/2021, Print       [place] Controlled Substances Registry consulted for this patient. I feel the risk/benefit ratio today is favorable for proceeding with this prescription for a controlled substance. Medication sedation precautions given.  Reviewed expectations re: course of current medical issues. Questions answered. Outlined signs and symptoms indicating need for more acute intervention. Patient verbalized understanding. After Visit Summary given.  SUBJECTIVE: History from: patient. Adam [name] is a 56 y.o. 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 No trauma reported. Occas tingling in fingers but this is a known problem. OTC analgesics without much help. Reports multiple R forearm surgeries as listed below:  Past Surgical History:  Procedure Laterality Date   CARPAL TUNNEL RELEASE Right 02/03/2013   Procedure: RIGHT CARPAL TUNNEL RELEASE;  Surgeon: Schuyler Amor, MD;  Location: [place];  Service: Orthopedics;  Laterality: Right;   CORONARY STENT INTERVENTION N/A 10/07/2017   Procedure: CORONARY STENT INTERVENTION;  Surgeon: Nigel Mormon, MD;  Location: [place] [facility];   Service: Cardiovascular;  Laterality: N/A;   CORONARY/GRAFT ACUTE MI REVASCULARIZATION N/A 10/06/2017   Procedure: Coronary/Graft Acute MI Revascularization;  Surgeon: Nigel Mormon, MD;  Location: [place] [facility];  Service: Cardiovascular;  Laterality: N/A;   LEFT HEART CATH AND CORONARY ANGIOGRAPHY N/A 10/06/2017   Procedure: LEFT HEART CATH AND CORONARY ANGIOGRAPHY;  Surgeon: Nigel Mormon, MD;  Location: [place] [facility];  Service: Cardiovascular;  Laterality: N/A;   TEMPORARY PACEMAKER N/A 10/06/2017   Procedure: TEMPORARY PACEMAKER;  Surgeon: Nigel Mormon, MD;  Location: [place] [facility];  Service: Cardiovascular;  Laterality: N/A;   ULNA OSTEOTOMY Right 02/03/2013   Procedure: RIGHT ULNAR SHORTENING OSTEOTOMY;  Surgeon: Schuyler Amor, MD;  Location: [place];  Service: Orthopedics;  Laterality: Right;   WISDOM TOOTH EXTRACTION  1999   WOUND EXPLORATION Left 09/18/2012   Procedure: EXPLORE AND REPAIR AS NEEDED LEFT WRIST;  Surgeon: Schuyler Amor, MD;  Location: [place];  Service: Orthopedics;  Laterality: Left;   WRIST ARTHROSCOPY Right 02/03/2013   Procedure: RIGHT WRIST ARTHROSCOPY WITH DEBRIDEMENT;  Surgeon: Schuyler Amor, MD;  Location: [place];  Service: Orthopedics;  Laterality: Right;     OBJECTIVE:  Vitals:   04/03/21 1118  BP: 131/84  Pulse: 71  Resp: 17  Temp: 98.1 F (36.7 C)  TempSrc: Oral  SpO2: 95%    General appearance: alert; no distress HEENT: [name]; AT Neck: supple with FROM Resp: unlabored respirations Extremities: R forearm: poorly localized tenderness/soreness; elbow and wrist with FROM; no gross abnormalities CV: brisk extremity capillary refill of RUE; 2+ radial pulse of RUE. Skin: warm  and dry; no visible rashes Neurologic: gait normal; normal sensation and strength of RUE Psychological: alert and cooperative; normal mood and affect  Imaging: DG Forearm Right  Result Date:  04/03/2021 CLINICAL DATA:  Right arm pain after multiple surgeries. EXAM: RIGHT FOREARM - 2 VIEW COMPARISON:  None. FINDINGS: Status post surgical internal fixation of distal right ulna. No acute fracture or dislocation is noted. No soft tissue abnormality is noted IMPRESSION: Postsurgical changes as described above.  No acute abnormality seen. Electronically Signed   By: Marijo Conception M.D.   On: 04/03/2021 12:10   DG Hand Complete Right  Result Date: 04/03/2021 CLINICAL DATA:  Right hand pain. EXAM: RIGHT HAND - COMPLETE 3+ VIEW COMPARISON:  None. FINDINGS: There is no evidence of fracture or dislocation. There is no evidence of arthropathy or other focal bone abnormality. Soft tissues are unremarkable. IMPRESSION: Negative. Electronically Signed   By: Marijo Conception M.D.   On: 04/03/2021 12:12        No Known Allergies  Past Medical History:  Diagnosis Date   Anxiety    Asthma    Carpal tunnel syndrome on both sides    Coronary artery disease involving native coronary artery of native heart without angina pectoris 11/09/2018   Head injury, acute, with loss of consciousness ([place])    hit on head with bucket at work- bucket fell.   History of kidney stones    Hypertension    no- was up a couple times whene he went to Dr , told he doesnt have HTN   Myocardial infarction ([place])    Tick bite    Social History   Socioeconomic History   Marital status: Significant Other    Spouse name: Not on file   Number of children: 2   Years of education: Not on file   Highest education level: Not on file  Occupational History   Not on file  Tobacco Use   Smoking status: Former    Packs/day: 1.00    Years: 25.00    Pack years: 25.00    Types: Cigarettes    Quit date: 09/2017    Years since quitting: 3.5   Smokeless tobacco: Former  Scientific laboratory technician Use: Never used  Substance and Sexual Activity   Alcohol use: Yes    Alcohol/week: 3.0 standard drinks    Types: 3 Cans of beer per week     Comment: maybe 6 beers a month   Drug use: No   Sexual activity: Yes  Other Topics Concern   Not on file  Social History Narrative   Not on file   Social Determinants of Health   Financial Resource Strain: Not on file  Food Insecurity: Not on file  Transportation Needs: Not on file  Physical Activity: Not on file  Stress: Not on file  Social Connections: Not on file   Family History  Problem Relation Age of Onset   Hypertension Father    Cancer Father    Heart failure Father    Past Surgical History:  Procedure Laterality Date   CARPAL TUNNEL RELEASE Right 02/03/2013   Procedure: RIGHT CARPAL TUNNEL RELEASE;  Surgeon: Schuyler Amor, MD;  Location: [place];  Service: Orthopedics;  Laterality: Right;   CORONARY STENT INTERVENTION N/A 10/07/2017   Procedure: CORONARY STENT INTERVENTION;  Surgeon: Nigel Mormon, MD;  Location: [place] [facility];  Service: Cardiovascular;  Laterality: N/A;   CORONARY/GRAFT ACUTE MI REVASCULARIZATION N/A  10/06/2017   Procedure: Coronary/Graft Acute MI Revascularization;  Surgeon: Nigel Mormon, MD;  Location: [place] [facility];  Service: Cardiovascular;  Laterality: N/A;   LEFT HEART CATH AND CORONARY ANGIOGRAPHY N/A 10/06/2017   Procedure: LEFT HEART CATH AND CORONARY ANGIOGRAPHY;  Surgeon: Nigel Mormon, MD;  Location: [place] [facility];  Service: Cardiovascular;  Laterality: N/A;   TEMPORARY PACEMAKER N/A 10/06/2017   Procedure: TEMPORARY PACEMAKER;  Surgeon: Nigel Mormon, MD;  Location: [place] [facility];  Service: Cardiovascular;  Laterality: N/A;   ULNA OSTEOTOMY Right 02/03/2013   Procedure: RIGHT ULNAR SHORTENING OSTEOTOMY;  Surgeon: Schuyler Amor, MD;  Location: [place];  Service: Orthopedics;  Laterality: Right;   WISDOM TOOTH EXTRACTION  1999   WOUND EXPLORATION Left 09/18/2012   Procedure: EXPLORE AND REPAIR AS NEEDED LEFT WRIST;  Surgeon: Schuyler Amor, MD;   Location: [place];  Service: Orthopedics;  Laterality: Left;   WRIST ARTHROSCOPY Right 02/03/2013   Procedure: RIGHT WRIST ARTHROSCOPY WITH DEBRIDEMENT;  Surgeon: Schuyler Amor, MD;  Location: [place];  Service: Orthopedics;  Laterality: Right;       Vanessa Kick, MD 04/03/21 1325

## 2021-04-04 ENCOUNTER — Other Ambulatory Visit: Payer: Self-pay

## 2021-04-04 MED FILL — Sildenafil Citrate Tab 100 MG: ORAL | 30 days supply | Qty: 10 | Fill #1 | Status: AC

## 2021-04-06 ENCOUNTER — Other Ambulatory Visit: Payer: Self-pay

## 2021-04-10 ENCOUNTER — Other Ambulatory Visit: Payer: Self-pay

## 2021-06-21 ENCOUNTER — Other Ambulatory Visit: Payer: Self-pay | Admitting: Nurse Practitioner

## 2021-06-21 ENCOUNTER — Other Ambulatory Visit: Payer: Self-pay

## 2021-06-21 DIAGNOSIS — N529 Male erectile dysfunction, unspecified: Secondary | ICD-10-CM

## 2021-06-22 ENCOUNTER — Other Ambulatory Visit: Payer: Self-pay

## 2021-08-21 ENCOUNTER — Other Ambulatory Visit: Payer: Self-pay

## 2021-08-21 ENCOUNTER — Other Ambulatory Visit: Payer: Self-pay | Admitting: Nurse Practitioner

## 2021-08-21 DIAGNOSIS — N529 Male erectile dysfunction, unspecified: Secondary | ICD-10-CM

## 2021-08-21 NOTE — Telephone Encounter (Signed)
Refill for sildenafil was refused.   LOV with PCP office was ion 11/10/2018 with Adam [name].   No refills will be sent until patient has a reestablish appt with new PCP.

## 2021-08-22 ENCOUNTER — Other Ambulatory Visit: Payer: Self-pay

## 2021-08-23 ENCOUNTER — Other Ambulatory Visit: Payer: Self-pay

## 2021-10-19 ENCOUNTER — Other Ambulatory Visit: Payer: Self-pay | Admitting: Nurse Practitioner

## 2021-10-19 ENCOUNTER — Ambulatory Visit: Payer: Self-pay | Admitting: Cardiology

## 2021-10-19 ENCOUNTER — Other Ambulatory Visit: Payer: Self-pay

## 2021-10-19 DIAGNOSIS — N529 Male erectile dysfunction, unspecified: Secondary | ICD-10-CM

## 2021-10-22 ENCOUNTER — Other Ambulatory Visit: Payer: Self-pay

## 2021-10-22 MED ORDER — SILDENAFIL CITRATE 100 MG PO TABS
ORAL_TABLET | ORAL | 11 refills | Status: DC
  Filled 2021-10-22 – 2021-12-14 (×2): qty 10, 30d supply, fill #0
  Filled 2022-01-15: qty 10, 30d supply, fill #1
  Filled 2022-02-11: qty 10, 30d supply, fill #2
  Filled 2022-03-13 – 2022-05-27 (×3): qty 10, 30d supply, fill #3
  Filled 2022-06-15: qty 10, 30d supply, fill #4
  Filled 2022-07-29: qty 10, 30d supply, fill #5
  Filled 2022-09-09: qty 10, 30d supply, fill #6
  Filled 2022-10-07: qty 10, 30d supply, fill #7

## 2021-10-26 ENCOUNTER — Other Ambulatory Visit: Payer: Self-pay

## 2021-10-29 ENCOUNTER — Other Ambulatory Visit: Payer: Self-pay

## 2021-11-01 ENCOUNTER — Other Ambulatory Visit: Payer: Self-pay

## 2021-11-01 ENCOUNTER — Other Ambulatory Visit: Payer: Self-pay | Admitting: Cardiology

## 2021-11-01 DIAGNOSIS — I252 Old myocardial infarction: Secondary | ICD-10-CM

## 2021-11-01 DIAGNOSIS — I2510 Atherosclerotic heart disease of native coronary artery without angina pectoris: Secondary | ICD-10-CM

## 2021-11-02 ENCOUNTER — Encounter (HOSPITAL_COMMUNITY): Payer: Self-pay

## 2021-11-02 ENCOUNTER — Ambulatory Visit (HOSPITAL_COMMUNITY)
Admission: RE | Admit: 2021-11-02 | Discharge: 2021-11-02 | Disposition: A | Discharge status: Home / Self Care | Payer: Self-pay | Source: Ambulatory Visit | Attending: Physician Assistant | Admitting: Physician Assistant

## 2021-11-02 VITALS — BP 133/87 | HR 72 | Temp 98.1°F | Resp 18

## 2021-11-02 DIAGNOSIS — B029 Zoster without complications: Secondary | ICD-10-CM

## 2021-11-02 MED ORDER — IBUPROFEN 800 MG PO TABS: 800.0000 mg | ORAL_TABLET | Freq: Three times a day (TID) | ORAL | 0 refills | Status: AC

## 2021-11-02 MED ORDER — VALACYCLOVIR HCL 1 G PO TABS: 1000.0000 mg | ORAL_TABLET | Freq: Three times a day (TID) | ORAL | 0 refills | Status: DC

## 2021-11-02 NOTE — ED Triage Notes (Signed)
Pt reports a headache, nausea, and a rash on the abdomen and left side x 5 days. States when laying down flat back pain and pressure increases.

## 2021-11-02 NOTE — ED Provider Notes (Signed)
[facility]    CSN: [id number] Arrival date & time: 11/02/21  1019      History   Chief Complaint Chief Complaint  Patient presents with   Rash    Headache,  back pain,  nausea - Entered by patient   Headache   Nausea    HPI Adam [name] is a 56 y.o. male.   Patient here today for evaluation of rash to his left flank and abdomen that started 5 days ago.  He reports that rash has spread and he now has significant sensitivity to the area.  He has difficulty lying down due to same.  He has had some headache and nausea as well.  He does not report fever. He has tried taking 400 mg ibuprofen without significant relief.   The history is provided by the patient.  Rash Associated symptoms: headaches and nausea   Associated symptoms: no fever, no shortness of breath and not vomiting   Headache Associated symptoms: nausea   Associated symptoms: no fever, no numbness and no vomiting     Past Medical History:  Diagnosis Date   Anxiety    Asthma    Carpal tunnel syndrome on both sides    Coronary artery disease involving native coronary artery of native heart without angina pectoris 11/09/2018   Head injury, acute, with loss of consciousness ([place])    hit on head with bucket at work- bucket fell.   History of kidney stones    Hypertension    no- was up a couple times whene he went to Dr , told he doesnt have HTN   Myocardial infarction [facility])    Tick bite     Patient Active Problem List   Diagnosis Date Noted   No-show for appointment 05/11/2019   Essential hypertension 11/10/2018   Panic attacks 11/10/2018   History of myocardial infarction 11/09/2018   Coronary artery disease involving native coronary artery of native heart without angina pectoris 11/09/2018   Tick bite 05/19/2018   Skin infection 05/19/2018   Anxiety 05/19/2018   Tobacco abuse 10/08/2017   Leukocytosis 10/08/2017   Mixed hyperlipidemia 10/08/2017   STEMI (ST elevation myocardial  infarction) ([place]) 10/06/2017    Past Surgical History:  Procedure Laterality Date   CARPAL TUNNEL RELEASE Right 02/03/2013   Procedure: RIGHT CARPAL TUNNEL RELEASE;  Surgeon: Schuyler Amor, MD;  Location: [place];  Service: Orthopedics;  Laterality: Right;   CORONARY STENT INTERVENTION N/A 10/07/2017   Procedure: CORONARY STENT INTERVENTION;  Surgeon: Nigel [facility], MD;  Location: [place] [facility];  Service: Cardiovascular;  Laterality: N/A;   CORONARY/GRAFT ACUTE MI REVASCULARIZATION N/A 10/06/2017   Procedure: Coronary/Graft Acute MI Revascularization;  Surgeon: Nigel [facility], MD;  Location: [place] [facility];  Service: Cardiovascular;  Laterality: N/A;   LEFT HEART CATH AND CORONARY ANGIOGRAPHY N/A 10/06/2017   Procedure: LEFT HEART CATH AND CORONARY ANGIOGRAPHY;  Surgeon: Nigel Mormon, MD;  Location: [place] [facility];  Service: Cardiovascular;  Laterality: N/A;   TEMPORARY PACEMAKER N/A 10/06/2017   Procedure: TEMPORARY PACEMAKER;  Surgeon: Nigel Mormon, MD;  Location: [place] [facility];  Service: Cardiovascular;  Laterality: N/A;   ULNA OSTEOTOMY Right 02/03/2013   Procedure: RIGHT ULNAR SHORTENING OSTEOTOMY;  Surgeon: Schuyler Amor, MD;  Location: [place];  Service: Orthopedics;  Laterality: Right;   WISDOM TOOTH EXTRACTION  1999   WOUND EXPLORATION Left 09/18/2012   Procedure: EXPLORE AND REPAIR AS  NEEDED LEFT WRIST;  Surgeon: Schuyler Amor, MD;  Location: [place];  Service: Orthopedics;  Laterality: Left;   WRIST ARTHROSCOPY Right 02/03/2013   Procedure: RIGHT WRIST ARTHROSCOPY WITH DEBRIDEMENT;  Surgeon: Schuyler Amor, MD;  Location: [place];  Service: Orthopedics;  Laterality: Right;       Home Medications    Prior to Admission medications   Medication Sig Start Date End Date Taking? Authorizing Provider  ibuprofen (ADVIL) 800 MG tablet Take 1 tablet (800 mg total) by mouth 3 (three)  times daily. 11/02/21  Yes Francene Finders, PA-C  valACYclovir (VALTREX) 1000 MG tablet Take 1 tablet (1,000 mg total) by mouth 3 (three) times daily. 11/02/21  Yes Francene Finders, PA-C  ALPRAZolam Duanne Moron) 0.25 MG tablet Take 1 tablet (0.25 mg total) by mouth 2 (two) times daily as needed for anxiety. 11/10/18   Azzie Glatter, FNP  aspirin 81 MG chewable tablet Chew 1 tablet (81 mg total) by mouth daily. 10/20/20   Patwardhan, Reynold Bowen, MD  calcium-vitamin D (OSCAL WITH D) 500-200 MG-UNIT tablet Take 1 tablet by mouth.    [provider]  HYDROcodone-acetaminophen (NORCO/VICODIN) 5-325 MG tablet Take 1 tablet by mouth every 6 (six) hours as needed for moderate pain or severe pain. 04/03/21   Vanessa Kick, MD  lisinopril (ZESTRIL) 5 MG tablet Take 1 tablet (5 mg total) by mouth daily. 10/20/20 11/21/21  Patwardhan, Reynold Bowen, MD  metoprolol succinate (TOPROL-XL) 25 MG 24 hr tablet Take 1 tablet (25 mg total) by mouth daily. 10/20/20 11/21/21  Patwardhan, Reynold Bowen, MD  Multiple Vitamins-Minerals (MULTIVITAMIN WITH MINERALS) tablet Take 1 tablet by mouth daily.    [provider]  nitroGLYCERIN (NITROSTAT) 0.4 MG SL tablet Place 1 tablet (0.4 mg total) under the tongue every 5 (five) minutes as needed for chest pain. 11/11/18   Patwardhan, Reynold Bowen, MD  predniSONE (DELTASONE) 20 MG tablet Take 2 tablets (40 mg total) by mouth daily. 04/03/21   Vanessa Kick, MD  rosuvastatin (CRESTOR) 10 MG tablet Take 1 tablet (10 mg total) by mouth daily. 10/20/20 11/21/21  Patwardhan, Reynold Bowen, MD  sildenafil (VIAGRA) 100 MG tablet TAKE 0.5-1 TABLETS (50-100 MG TOTAL) BY MOUTH DAILY AS NEEDED FOR ERECTILE DYSFUNCTION. 10/22/21 10/22/22  Fenton Foy, NP    Family History Family History  Problem Relation Age of Onset   Hypertension Father    Cancer Father    Heart failure Father     Social History Social History   Tobacco Use   Smoking status: Former    Packs/day: 1.00    Years: 25.00     Total pack years: 25.00    Types: Cigarettes    Quit date: 09/2017    Years since quitting: 4.0   Smokeless tobacco: Former  Scientific laboratory technician Use: Never used  Substance Use Topics   Alcohol use: Yes    Alcohol/week: 3.0 standard drinks of alcohol    Types: 3 Cans of beer per week    Comment: maybe 6 beers a month   Drug use: No     Allergies   Patient has no known allergies.   Review of Systems Review of Systems  Constitutional:  Negative for chills and fever.  Eyes:  Negative for discharge and redness.  Respiratory:  Negative for shortness of breath.   Gastrointestinal:  Positive for nausea. Negative for vomiting.  Skin:  Positive for color change and rash.  Neurological:  Positive  for headaches. Negative for numbness.     Physical Exam Triage Vital Signs ED Triage Vitals [11/02/21 1039]  Enc Vitals Group     BP 133/87     Pulse Rate 72     Resp 18     Temp 98.1 F (36.7 C)     Temp Source Oral     SpO2 98 %     Weight      Height      Head Circumference      Peak Flow      Pain Score 8     Pain Loc      Pain Edu?      Excl. in [place]?    No data found.  Updated Vital Signs BP 133/87 (BP Location: Right Arm)   Pulse 72   Temp 98.1 F (36.7 C) (Oral)   Resp 18   SpO2 98%     Physical Exam Vitals and nursing note reviewed.  Constitutional:      General: He is not in acute distress.    Appearance: Normal appearance. He is not ill-appearing.  HENT:     Head: Normocephalic and atraumatic.  Eyes:     Conjunctiva/sclera: Conjunctivae normal.  Cardiovascular:     Rate and Rhythm: Normal rate.  Pulmonary:     Effort: Pulmonary effort is normal.  Skin:    Findings: Rash (erythematous vesicular rash noted to follow dermatome to left back into left abdomen) present.  Neurological:     Mental Status: He is alert.  Psychiatric:        Mood and Affect: Mood normal.        Behavior: Behavior normal.        Thought Content: Thought content normal.       UC Treatments / Results  Labs (all labs ordered are listed, but only abnormal results are displayed) Labs Reviewed - No data to display  EKG   Radiology No results found.  Procedures Procedures (including critical care time)  Medications Ordered in UC Medications - No data to display  Initial Impression / Assessment and Plan / UC Course  I have reviewed the triage vital signs and the nursing notes.  Pertinent labs & imaging results that were available during my care of the patient were reviewed by me and considered in my medical decision making (see chart for details).    Rash consistent with shingles.  Will treat with valacyclovir and ibuprofen. Discussed gabapentin prescription but patient kindly declines as he has taken this in the past with little relief.  Recommended follow-up if no gradual improvement or with any further concerns.  Final Clinical Impressions(s) / UC Diagnoses   Final diagnoses:  Herpes zoster without complication   Discharge Instructions   None    ED Prescriptions     Medication Sig Dispense Auth. Provider   valACYclovir (VALTREX) 1000 MG tablet Take 1 tablet (1,000 mg total) by mouth 3 (three) times daily. 21 tablet Ewell Poe F, PA-C   ibuprofen (ADVIL) 800 MG tablet Take 1 tablet (800 mg total) by mouth 3 (three) times daily. 21 tablet Francene Finders, PA-C      PDMP not reviewed this encounter.   Francene Finders, PA-C 11/02/21 1131

## 2021-11-06 ENCOUNTER — Other Ambulatory Visit: Payer: Self-pay

## 2021-11-29 ENCOUNTER — Other Ambulatory Visit (HOSPITAL_COMMUNITY): Payer: Self-pay

## 2021-11-29 ENCOUNTER — Ambulatory Visit (HOSPITAL_COMMUNITY)
Admission: RE | Admit: 2021-11-29 | Discharge: 2021-11-29 | Disposition: A | Discharge status: Home / Self Care | Payer: Self-pay | Source: Ambulatory Visit | Attending: Internal Medicine | Admitting: Internal Medicine

## 2021-11-29 ENCOUNTER — Other Ambulatory Visit: Payer: Self-pay

## 2021-11-29 ENCOUNTER — Telehealth (HOSPITAL_COMMUNITY): Payer: Self-pay | Admitting: Internal Medicine

## 2021-11-29 ENCOUNTER — Encounter (HOSPITAL_COMMUNITY): Payer: Self-pay

## 2021-11-29 VITALS — BP 146/91 | HR 76 | Temp 97.8°F | Resp 16

## 2021-11-29 DIAGNOSIS — R1013 Epigastric pain: Secondary | ICD-10-CM

## 2021-11-29 DIAGNOSIS — B0229 Other postherpetic nervous system involvement: Secondary | ICD-10-CM

## 2021-11-29 LAB — COMPREHENSIVE METABOLIC PANEL
ALT: 29 U/L (ref 0–44)
AST: 33 U/L (ref 15–41)
Albumin: 3.9 g/dL (ref 3.5–5.0)
Alkaline Phosphatase: 79 U/L (ref 38–126)
Anion gap: 7 (ref 5–15)
BUN: 11 mg/dL (ref 6–20)
CO2: 26 mmol/L (ref 22–32)
Calcium: 9.7 mg/dL (ref 8.9–10.3)
Chloride: 104 mmol/L (ref 98–111)
Creatinine, Ser: 1.09 mg/dL (ref 0.61–1.24)
GFR, Estimated: >60 mL/min (ref >60)
Glucose, Bld: 119 mg/dL — ABNORMAL HIGH (ref 70–99)
Potassium: 3.8 mmol/L (ref 3.5–5.1)
Sodium: 137 mmol/L (ref 135–145)
Total Bilirubin: 0.5 mg/dL (ref 0.3–1.2)
Total Protein: 7.1 g/dL (ref 6.5–8.1)

## 2021-11-29 LAB — CBC WITH DIFFERENTIAL/PLATELET
Abs Immature Granulocytes: 0.04 10*3/uL (ref 0.00–0.07)
Basophils Absolute: 0.1 10*3/uL (ref 0.0–0.1)
Basophils Relative: 1 %
Eosinophils Absolute: 0.6 10*3/uL — ABNORMAL HIGH (ref 0.0–0.5)
Eosinophils Relative: 5 %
HCT: 44.4 % (ref 39.0–52.0)
Hemoglobin: 15.7 g/dL (ref 13.0–17.0)
Immature Granulocytes: 0 %
Lymphocytes Relative: 26 %
Lymphs Abs: 2.8 10*3/uL (ref 0.7–4.0)
MCH: 31 pg (ref 26.0–34.0)
MCHC: 35.4 g/dL (ref 30.0–36.0)
MCV: 87.6 fL (ref 80.0–100.0)
Monocytes Absolute: 1 10*3/uL (ref 0.1–1.0)
Monocytes Relative: 9 %
Neutro Abs: 6.1 10*3/uL (ref 1.7–7.7)
Neutrophils Relative %: 59 %
Platelets: 343 10*3/uL (ref 150–400)
RBC: 5.07 MIL/uL (ref 4.22–5.81)
RDW: 14.2 % (ref 11.5–15.5)
WBC: 10.4 10*3/uL (ref 4.0–10.5)
nRBC: 0 % (ref 0.0–0.2)

## 2021-11-29 MED ORDER — GABAPENTIN 100 MG PO CAPS
100.0000 mg | ORAL_CAPSULE | Freq: Two times a day (BID) | ORAL | 0 refills | Status: DC
  Filled 2021-11-29: qty 60, 30d supply, fill #0

## 2021-11-29 MED ORDER — PANTOPRAZOLE SODIUM 20 MG PO TBEC
20.0000 mg | DELAYED_RELEASE_TABLET | Freq: Every day | ORAL | 0 refills | Status: AC
  Filled 2021-11-29: qty 30, 30d supply, fill #0

## 2021-11-29 NOTE — ED Provider Notes (Signed)
[facility]    CSN: [id number] Arrival date & time: 11/29/21  4098      History   Chief Complaint Chief Complaint  Patient presents with   Herpes Zoster    HPI HAMZEH TALL is a 56 y.o. male comes to urgent care with abdominal pain over the left side of the abdomen.  Patient also describes epigastric/upper half abdominal pain.  Patient was treated for shingles about 3 weeks ago.  He completed a course of valacyclovir.  Rash is improving and healing well but the patient has been experiencing pain in the epigastric and left upper quadrant abdomen.  He endorses a 15 pound weight loss over the past 3 weeks.  He has early satiety and nausea but no vomiting.  No changes in bowel movements.  No night sweats or gland swelling.  Patient denies any dizziness, near syncope or syncopal episodes.  No fever or chills.  No shortness of breath or wheezing.  No chronic alcohol use.  No pain or difficulty with swallowing.  HPI  Past Medical History:  Diagnosis Date   Anxiety    Asthma    Carpal tunnel syndrome on both sides    Coronary artery disease involving native coronary artery of native heart without angina pectoris 11/09/2018   Head injury, acute, with loss of consciousness ([place])    hit on head with bucket at work- bucket fell.   History of kidney stones    Hypertension    no- was up a couple times whene he went to Dr , told he doesnt have HTN   Myocardial infarction [facility])    Tick bite     Patient Active Problem List   Diagnosis Date Noted   No-show for appointment 05/11/2019   Essential hypertension 11/10/2018   Panic attacks 11/10/2018   History of myocardial infarction 11/09/2018   Coronary artery disease involving native coronary artery of native heart without angina pectoris 11/09/2018   Tick bite 05/19/2018   Skin infection 05/19/2018   Anxiety 05/19/2018   Tobacco abuse 10/08/2017   Leukocytosis 10/08/2017   Mixed hyperlipidemia 10/08/2017   STEMI (ST  elevation myocardial infarction) ([place]) 10/06/2017    Past Surgical History:  Procedure Laterality Date   CARPAL TUNNEL RELEASE Right 02/03/2013   Procedure: RIGHT CARPAL TUNNEL RELEASE;  Surgeon: Schuyler Amor, MD;  Location: [place];  Service: Orthopedics;  Laterality: Right;   CORONARY STENT INTERVENTION N/A 10/07/2017   Procedure: CORONARY STENT INTERVENTION;  Surgeon: Nigel [facility], MD;  Location: [place] [facility];  Service: Cardiovascular;  Laterality: N/A;   CORONARY/GRAFT ACUTE MI REVASCULARIZATION N/A 10/06/2017   Procedure: Coronary/Graft Acute MI Revascularization;  Surgeon: Nigel Mormon, MD;  Location: [place] [facility];  Service: Cardiovascular;  Laterality: N/A;   LEFT HEART CATH AND CORONARY ANGIOGRAPHY N/A 10/06/2017   Procedure: LEFT HEART CATH AND CORONARY ANGIOGRAPHY;  Surgeon: Nigel Mormon, MD;  Location: [place] [facility];  Service: Cardiovascular;  Laterality: N/A;   TEMPORARY PACEMAKER N/A 10/06/2017   Procedure: TEMPORARY PACEMAKER;  Surgeon: Nigel Mormon, MD;  Location: [place] [facility];  Service: Cardiovascular;  Laterality: N/A;   ULNA OSTEOTOMY Right 02/03/2013   Procedure: RIGHT ULNAR SHORTENING OSTEOTOMY;  Surgeon: Schuyler Amor, MD;  Location: [place];  Service: Orthopedics;  Laterality: Right;   WISDOM TOOTH EXTRACTION  1999   WOUND EXPLORATION Left 09/18/2012   Procedure: EXPLORE AND REPAIR AS NEEDED LEFT WRIST;  Surgeon:  Schuyler Amor, MD;  Location: [place];  Service: Orthopedics;  Laterality: Left;   WRIST ARTHROSCOPY Right 02/03/2013   Procedure: RIGHT WRIST ARTHROSCOPY WITH DEBRIDEMENT;  Surgeon: Schuyler Amor, MD;  Location: [place];  Service: Orthopedics;  Laterality: Right;       Home Medications    Prior to Admission medications   Medication Sig Start Date End Date Taking? Authorizing Provider  ALPRAZolam (XANAX) 0.25 MG tablet Take 1 tablet (0.25 mg  total) by mouth 2 (two) times daily as needed for anxiety. 11/10/18  Yes Azzie Glatter, FNP  aspirin 81 MG chewable tablet Chew 1 tablet (81 mg total) by mouth daily. 10/20/20  Yes Patwardhan, Reynold Bowen, MD  calcium-vitamin D (OSCAL WITH D) 500-200 MG-UNIT tablet Take 1 tablet by mouth.   Yes [provider]  gabapentin (NEURONTIN) 100 MG capsule Take 1 capsule (100 mg total) by mouth 2 (two) times daily. 11/29/21  Yes Yamil Oelke, Myrene Galas, MD  Multiple Vitamins-Minerals (MULTIVITAMIN WITH MINERALS) tablet Take 1 tablet by mouth daily.   Yes [provider]  nitroGLYCERIN (NITROSTAT) 0.4 MG SL tablet Place 1 tablet (0.4 mg total) under the tongue every 5 (five) minutes as needed for chest pain. 11/11/18  Yes Patwardhan, Manish J, MD  pantoprazole (PROTONIX) 20 MG tablet Take 1 tablet (20 mg total) by mouth daily. 11/29/21  Yes Kendyl Bissonnette, Myrene Galas, MD  HYDROcodone-acetaminophen (NORCO/VICODIN) 5-325 MG tablet Take 1 tablet by mouth every 6 (six) hours as needed for moderate pain or severe pain. 04/03/21   Vanessa Kick, MD  ibuprofen (ADVIL) 800 MG tablet Take 1 tablet (800 mg total) by mouth 3 (three) times daily. 11/02/21   Francene Finders, PA-C  lisinopril (ZESTRIL) 5 MG tablet Take 1 tablet (5 mg total) by mouth daily. 10/20/20 11/21/21  Patwardhan, Reynold Bowen, MD  metoprolol succinate (TOPROL-XL) 25 MG 24 hr tablet Take 1 tablet (25 mg total) by mouth daily. 10/20/20 11/21/21  Patwardhan, Reynold Bowen, MD  rosuvastatin (CRESTOR) 10 MG tablet Take 1 tablet (10 mg total) by mouth daily. 10/20/20 11/21/21  Patwardhan, Reynold Bowen, MD  sildenafil (VIAGRA) 100 MG tablet TAKE 0.5-1 TABLETS (50-100 MG TOTAL) BY MOUTH DAILY AS NEEDED FOR ERECTILE DYSFUNCTION. 10/22/21 10/22/22  Fenton Foy, NP    Family History Family History  Problem Relation Age of Onset   Hypertension Father    Cancer Father    Heart failure Father     Social History Social History   Tobacco Use   Smoking status: Former     Packs/day: 1.00    Years: 25.00    Total pack years: 25.00    Types: Cigarettes    Quit date: 09/2017    Years since quitting: 4.1   Smokeless tobacco: Former  Scientific laboratory technician Use: Never used  Substance Use Topics   Alcohol use: Yes    Alcohol/week: 3.0 standard drinks of alcohol    Types: 3 Cans of beer per week    Comment: maybe 6 beers a month   Drug use: No     Allergies   Patient has no known allergies.   Review of Systems Review of Systems As per HPI  Physical Exam Triage Vital Signs ED Triage Vitals  Enc Vitals Group     BP 11/29/21 1007 (!) 146/91     Pulse Rate 11/29/21 1007 76     Resp 11/29/21 1007 16     Temp 11/29/21 1007 97.8 F (  36.6 C)     Temp Source 11/29/21 1007 Oral     SpO2 11/29/21 1007 97 %     Weight --      Height --      Head Circumference --      Peak Flow --      Pain Score 11/29/21 1005 6     Pain Loc --      Pain Edu? --      Excl. in [name]? --    No data found.  Updated Vital Signs BP (!) 146/91 (BP Location: Right Arm)   Pulse 76   Temp 97.8 F (36.6 C) (Oral)   Resp 16   SpO2 97%   Visual Acuity Right Eye Distance:   Left Eye Distance:   Bilateral Distance:    Right Eye Near:   Left Eye Near:    Bilateral Near:     Physical Exam Vitals and nursing note reviewed.  Constitutional:      General: He is not in acute distress.    Appearance: Normal appearance. He is not ill-appearing, toxic-appearing or diaphoretic.  Cardiovascular:     Rate and Rhythm: Normal rate and regular rhythm.     Pulses: Normal pulses.     Heart sounds: Normal heart sounds.  Pulmonary:     Effort: Pulmonary effort is normal.     Breath sounds: Normal breath sounds.  Abdominal:     General: Bowel sounds are normal.     Palpations: Abdomen is soft.     Tenderness: There is no abdominal tenderness. There is no guarding or rebound.  Musculoskeletal:        General: Normal range of motion.  Skin:    Comments: Left upper quadrant  erythematous rash consistent with shingles.  The rash has healed very well.  Neurological:     Mental Status: He is alert.      UC Treatments / Results  Labs (all labs ordered are listed, but only abnormal results are displayed) Labs Reviewed  CBC WITH DIFFERENTIAL/PLATELET - Abnormal; Notable for the following components:      Result Value   Eosinophils Absolute 0.6 (*)    All other components within normal limits  COMPREHENSIVE METABOLIC PANEL  LIPASE, BLOOD    EKG   Radiology No results found.  Procedures Procedures (including critical care time)  Medications Ordered in UC Medications - No data to display  Initial Impression / Assessment and Plan / UC Course  I have reviewed the triage vital signs and the nursing notes.  Pertinent labs & imaging results that were available during my care of the patient were reviewed by me and considered in my medical decision making (see chart for details).     1.  Abdominal pain: This may be related to postherpetic neuralgia-gabapentin has been prescribed to help with pain Patient also describes epigastric pain which is concerning for gastritis-Protonix 20 mg orally daily.  CBC, CMP and lipase have been drawn. If lab results does not explain the recent weight loss, patient will benefit from advanced imaging of the abdomen. Increase oral fluid intake Return precautions given. Final Clinical Impressions(s) / UC Diagnoses   Final diagnoses:  Postherpetic neuralgia  Abdominal pain, epigastric     Discharge Instructions      Please take medications as prescribed Increase oral fluid intake We will call you with recommendations if labs are abnormal If you continue to experience weight loss, early satiety and persistent nausea-you would benefit from going to  the emergency room to get a CT scan of the abdomen Return to urgent care if you have any other concerns. We will set you up with a primary care provider.   ED  Prescriptions     Medication Sig Dispense Auth. Provider   pantoprazole (PROTONIX) 20 MG tablet Take 1 tablet (20 mg total) by mouth daily. 30 tablet Lakera Viall, Myrene Galas, MD   gabapentin (NEURONTIN) 100 MG capsule Take 1 capsule (100 mg total) by mouth 2 (two) times daily. 60 capsule Jabria Loos, Myrene Galas, MD      PDMP not reviewed this encounter.   Chase Picket, MD 11/29/21 1308

## 2021-11-29 NOTE — ED Notes (Signed)
At approximately 13:55 today , the [facility] laboratory notified me that blood work drawn on [name] CMP,& Lipase could not be tested due to Hemolyzation. I called [name] to notify him that he should return for a redraw. Spoke with Carolin Sicks who's telephone number he has listed to please call and let us know if he can return today to redraw. Will try to come in tonight before closing,or first thing tomorrow.     Chaeli Judy R.T. (R) (M)

## 2021-11-29 NOTE — Telephone Encounter (Signed)
Labs redraw ordered

## 2021-11-29 NOTE — ED Triage Notes (Signed)
Patient was seen a month ago and diagnosed with shingles. Was given a 7 day rx which helped but did not clear things 100 percent.   Still having back and abdominal pain.

## 2021-11-29 NOTE — Discharge Instructions (Addendum)
Please take medications as prescribed Increase oral fluid intake We will call you with recommendations if labs are abnormal If you continue to experience weight loss, early satiety and persistent nausea-you would benefit from going to the emergency room to get a CT scan of the abdomen Return to urgent care if you have any other concerns. We will set you up with a primary care provider.

## 2021-12-05 ENCOUNTER — Other Ambulatory Visit: Payer: Self-pay

## 2021-12-14 ENCOUNTER — Other Ambulatory Visit: Payer: Self-pay | Admitting: Cardiology

## 2021-12-14 ENCOUNTER — Other Ambulatory Visit: Payer: Self-pay

## 2021-12-14 DIAGNOSIS — I252 Old myocardial infarction: Secondary | ICD-10-CM

## 2021-12-14 DIAGNOSIS — I2510 Atherosclerotic heart disease of native coronary artery without angina pectoris: Secondary | ICD-10-CM

## 2021-12-17 ENCOUNTER — Other Ambulatory Visit: Payer: Self-pay

## 2021-12-17 MED ORDER — ROSUVASTATIN CALCIUM 10 MG PO TABS
10.0000 mg | ORAL_TABLET | Freq: Every day | ORAL | 3 refills | Status: DC
  Filled 2021-12-17: qty 30, 30d supply, fill #0
  Filled 2022-01-15: qty 30, 30d supply, fill #1
  Filled 2022-02-11: qty 30, 30d supply, fill #2
  Filled 2022-03-13 – 2022-05-27 (×3): qty 30, 30d supply, fill #3
  Filled 2022-06-15: qty 30, 30d supply, fill #4
  Filled 2022-07-29: qty 30, 30d supply, fill #5
  Filled 2022-09-09: qty 30, 30d supply, fill #6
  Filled 2022-10-07 – 2022-10-31 (×2): qty 30, 30d supply, fill #7

## 2021-12-17 MED ORDER — METOPROLOL SUCCINATE ER 25 MG PO TB24
25.0000 mg | ORAL_TABLET | Freq: Every day | ORAL | 3 refills | Status: DC
  Filled 2021-12-17: qty 30, 30d supply, fill #0
  Filled 2022-01-15: qty 30, 30d supply, fill #1
  Filled 2022-02-11: qty 30, 30d supply, fill #2
  Filled 2022-03-13 – 2022-05-15 (×2): qty 30, 30d supply, fill #3
  Filled 2022-06-15: qty 30, 30d supply, fill #4
  Filled 2022-07-29: qty 30, 30d supply, fill #5
  Filled 2022-09-09: qty 30, 30d supply, fill #6
  Filled 2022-10-07 – 2022-10-31 (×2): qty 30, 30d supply, fill #7

## 2021-12-17 MED ORDER — LISINOPRIL 5 MG PO TABS
5.0000 mg | ORAL_TABLET | Freq: Every day | ORAL | 3 refills | Status: DC
  Filled 2021-12-17: qty 30, 30d supply, fill #0
  Filled 2022-01-15: qty 30, 30d supply, fill #1
  Filled 2022-02-11: qty 30, 30d supply, fill #2
  Filled 2022-03-13 – 2022-05-15 (×2): qty 30, 30d supply, fill #3
  Filled 2022-06-15: qty 30, 30d supply, fill #4
  Filled 2022-07-29: qty 30, 30d supply, fill #5
  Filled 2022-09-09: qty 30, 30d supply, fill #6
  Filled 2022-10-07 – 2022-10-31 (×2): qty 30, 30d supply, fill #7

## 2021-12-18 ENCOUNTER — Ambulatory Visit: Payer: Self-pay | Admitting: Nurse Practitioner

## 2022-01-15 ENCOUNTER — Other Ambulatory Visit: Payer: Self-pay

## 2022-02-11 ENCOUNTER — Other Ambulatory Visit: Payer: Self-pay

## 2022-02-14 ENCOUNTER — Other Ambulatory Visit: Payer: Self-pay

## 2022-03-13 ENCOUNTER — Other Ambulatory Visit: Payer: Self-pay

## 2022-03-19 ENCOUNTER — Other Ambulatory Visit: Payer: Self-pay

## 2022-03-20 ENCOUNTER — Other Ambulatory Visit: Payer: Self-pay

## 2022-03-28 ENCOUNTER — Encounter (HOSPITAL_COMMUNITY): Payer: Self-pay

## 2022-03-28 ENCOUNTER — Ambulatory Visit (HOSPITAL_COMMUNITY)
Admission: RE | Admit: 2022-03-28 | Discharge: 2022-03-28 | Disposition: A | Discharge status: Home / Self Care | Payer: Self-pay | Source: Ambulatory Visit | Attending: Family Medicine | Admitting: Family Medicine

## 2022-03-28 ENCOUNTER — Other Ambulatory Visit: Payer: Self-pay

## 2022-03-28 VITALS — BP 144/87 | HR 75 | Temp 98.1°F | Resp 18

## 2022-03-28 DIAGNOSIS — H60391 Other infective otitis externa, right ear: Secondary | ICD-10-CM

## 2022-03-28 MED ORDER — NEOMYCIN-POLYMYXIN-HC 3.5-10000-1 OT SUSP: 4.0000 [drp] | Freq: Three times a day (TID) | OTIC | 0 refills | Status: AC

## 2022-03-28 NOTE — ED Provider Notes (Signed)
Adam [name]   OI:[id number] 03/28/22 Arrival Time: [id number]  ASSESSMENT & PLAN:  1. Other infective acute otitis externa of right ear    Ear wick placed into R EAC without complication. Begin: Meds ordered this encounter  Medications   neomycin-polymyxin-hydrocortisone (CORTISPORIN) 3.5-10000-1 OTIC suspension    Sig: Place 4 drops into the right ear 3 (three) times daily.    Dispense:  10 mL    Refill:  0   May f/u here in a few days for recheck, sooner if needed  Reviewed expectations re: course of current medical issues. Questions answered. Outlined signs and symptoms indicating need for more acute intervention. Patient verbalized understanding. After Visit Summary given.   SUBJECTIVE: History from: patient.  Adam [name] is a 57 y.o. male who presents with complaint of fairly persistent R ear pain; with "lime-green drainage"; first noted 1-2 w ago; slowly worsening; muffled hearing. Denies fever. Occasional Q-tip use in ears. Denies ear trauma. No tx PTA.  Social History   Tobacco Use  Smoking Status Former   Packs/day: 1.00   Years: 25.00   Total pack years: 25.00   Types: Cigarettes   Quit date: 09/2017   Years since quitting: 4.4  Smokeless Tobacco Former      OBJECTIVE:  Vitals:   03/28/22 1156  BP: (!) 144/87  Pulse: 75  Resp: 18  Temp: 98.1 F (36.7 C)  TempSrc: Oral  SpO2: 97%     General appearance: alert Ear Canal: edema and inflammation on the right TM: unable to visualize on the right; left appears normal Neck: supple without LAD Lungs: unlabored respirations, symmetrical air entry; cough: absent; no respiratory distress Skin: warm and dry Psychological: alert and cooperative; normal mood and affect  No Known Allergies  Past Medical History:  Diagnosis Date   Anxiety    Asthma    Carpal tunnel syndrome on both sides    Coronary artery disease involving native coronary artery of native heart without angina pectoris  11/09/2018   Head injury, acute, with loss of consciousness ([place])    hit on head with bucket at work- bucket fell.   History of kidney stones    Hypertension    no- was up a couple times whene he went to Dr , told he doesnt have HTN   Myocardial infarction ([place])    Tick bite    Family History  Problem Relation Age of Onset   Hypertension Father    Cancer Father    Heart failure Father    Social History   Socioeconomic History   Marital status: Significant Other    Spouse name: Not on file   Number of children: 2   Years of education: Not on file   Highest education level: Not on file  Occupational History   Not on file  Tobacco Use   Smoking status: Former    Packs/day: 1.00    Years: 25.00    Total pack years: 25.00    Types: Cigarettes    Quit date: 09/2017    Years since quitting: 4.4   Smokeless tobacco: Former  Scientific laboratory technician Use: Never used  Substance and Sexual Activity   Alcohol use: Yes    Alcohol/week: 3.0 standard drinks of alcohol    Types: 3 Cans of beer per week    Comment: maybe 6 beers a month   Drug use: No   Sexual activity: Yes  Other Topics Concern   Not on file  Social History Narrative   Not on file   Social Determinants of Health   Financial Resource Strain: Medium Risk (10/06/2017)   Overall Financial Resource Strain (CARDIA)    Difficulty of Paying Living Expenses: Somewhat hard  Food Insecurity: Food Insecurity Present (10/06/2017)   Hunger Vital Sign    Worried About Running Out of Food in the Last Year: Sometimes true    Ran Out of Food in the Last Year: Sometimes true  Transportation Needs: No Transportation Needs (10/06/2017)   PRAPARE - Hydrologist (Medical): No    Lack of Transportation (Non-Medical): No  Physical Activity: Unknown (10/06/2017)   Exercise Vital Sign    Days of Exercise per Week: 4 days    Minutes of Exercise per Session: Not on file  Stress: No Stress Concern Present (10/06/2017)    Hunt    Feeling of Stress : Only a little  Social Connections: Moderately Integrated (10/06/2017)   Social Connection and Isolation Panel [NHANES]    Frequency of Communication with Friends and Family: More than three times a week    Frequency of Social Gatherings with Friends and Family: More than three times a week    Attends Religious Services: More than 4 times per year    Active Member of [employer] or Organizations: Not on file    Attends Archivist Meetings: More than 4 times per year    Marital Status: Never married  Intimate Partner Violence: Not At Risk (10/06/2017)   Humiliation, Afraid, Rape, and Kick questionnaire    Fear of Current or Ex-Partner: No    Emotionally Abused: No    Physically Abused: No    Sexually Abused: No             Vanessa Kick, MD 03/28/22 [phone]

## 2022-03-28 NOTE — ED Triage Notes (Signed)
Right ear hurting for 2 weeks.  Reports "lime-green "color drainage last week and bloody drainage on qtip.  Can hear slightly.  Now feels left ear is getting stuffy.  Denies uri symptoms.    Patient has used alcohol in ears.

## 2022-05-15 ENCOUNTER — Other Ambulatory Visit (HOSPITAL_COMMUNITY): Payer: Self-pay

## 2022-05-15 ENCOUNTER — Other Ambulatory Visit: Payer: Self-pay

## 2022-05-21 ENCOUNTER — Other Ambulatory Visit: Payer: Self-pay

## 2022-05-27 ENCOUNTER — Other Ambulatory Visit: Payer: Self-pay

## 2022-06-17 ENCOUNTER — Other Ambulatory Visit: Payer: Self-pay

## 2022-06-18 ENCOUNTER — Other Ambulatory Visit: Payer: Self-pay

## 2022-07-29 ENCOUNTER — Other Ambulatory Visit: Payer: Self-pay

## 2022-07-30 ENCOUNTER — Other Ambulatory Visit: Payer: Self-pay

## 2022-09-09 ENCOUNTER — Other Ambulatory Visit: Payer: Self-pay

## 2022-10-07 ENCOUNTER — Other Ambulatory Visit: Payer: Self-pay

## 2022-10-11 ENCOUNTER — Other Ambulatory Visit: Payer: Self-pay

## 2022-10-31 ENCOUNTER — Other Ambulatory Visit: Payer: Self-pay | Admitting: Nurse Practitioner

## 2022-10-31 ENCOUNTER — Other Ambulatory Visit: Payer: Self-pay

## 2022-10-31 DIAGNOSIS — N529 Male erectile dysfunction, unspecified: Secondary | ICD-10-CM

## 2022-10-31 MED ORDER — SILDENAFIL CITRATE 100 MG PO TABS
50.0000 mg | ORAL_TABLET | Freq: Every day | ORAL | 11 refills | Status: DC | PRN
  Filled 2022-10-31 – 2022-12-24 (×2): qty 10, 30d supply, fill #0

## 2022-11-04 ENCOUNTER — Other Ambulatory Visit: Payer: Self-pay

## 2022-11-12 ENCOUNTER — Other Ambulatory Visit: Payer: Self-pay

## 2022-12-24 ENCOUNTER — Other Ambulatory Visit: Payer: Self-pay | Admitting: Cardiology

## 2022-12-24 ENCOUNTER — Other Ambulatory Visit: Payer: Self-pay

## 2022-12-24 DIAGNOSIS — I252 Old myocardial infarction: Secondary | ICD-10-CM

## 2022-12-24 DIAGNOSIS — I2510 Atherosclerotic heart disease of native coronary artery without angina pectoris: Secondary | ICD-10-CM

## 2023-01-03 ENCOUNTER — Other Ambulatory Visit: Payer: Self-pay

## 2023-03-18 ENCOUNTER — Other Ambulatory Visit: Payer: Self-pay

## 2023-10-09 ENCOUNTER — Other Ambulatory Visit: Payer: Self-pay

## 2023-10-09 ENCOUNTER — Emergency Department (EMERGENCY_DEPARTMENT_HOSPITAL): Payer: Self-pay

## 2023-10-09 ENCOUNTER — Emergency Department (HOSPITAL_COMMUNITY)
Admission: EM | Admit: 2023-10-09 | Discharge: 2023-10-09 | Disposition: A | Discharge status: Home / Self Care | Payer: Self-pay | Attending: Emergency Medicine | Admitting: Emergency Medicine

## 2023-10-09 ENCOUNTER — Emergency Department (HOSPITAL_COMMUNITY): Payer: Self-pay

## 2023-10-09 ENCOUNTER — Encounter (HOSPITAL_COMMUNITY): Payer: Self-pay

## 2023-10-09 DIAGNOSIS — R112 Nausea with vomiting, unspecified: Secondary | ICD-10-CM | POA: Insufficient documentation

## 2023-10-09 DIAGNOSIS — I2510 Atherosclerotic heart disease of native coronary artery without angina pectoris: Secondary | ICD-10-CM | POA: Insufficient documentation

## 2023-10-09 DIAGNOSIS — J45909 Unspecified asthma, uncomplicated: Secondary | ICD-10-CM | POA: Insufficient documentation

## 2023-10-09 DIAGNOSIS — I10 Essential (primary) hypertension: Secondary | ICD-10-CM | POA: Insufficient documentation

## 2023-10-09 DIAGNOSIS — Z7982 Long term (current) use of aspirin: Secondary | ICD-10-CM | POA: Insufficient documentation

## 2023-10-09 DIAGNOSIS — J181 Lobar pneumonia, unspecified organism: Secondary | ICD-10-CM | POA: Insufficient documentation

## 2023-10-09 DIAGNOSIS — D72829 Elevated white blood cell count, unspecified: Secondary | ICD-10-CM | POA: Insufficient documentation

## 2023-10-09 DIAGNOSIS — J189 Pneumonia, unspecified organism: Secondary | ICD-10-CM

## 2023-10-09 DIAGNOSIS — R079 Chest pain, unspecified: Secondary | ICD-10-CM

## 2023-10-09 DIAGNOSIS — Z79899 Other long term (current) drug therapy: Secondary | ICD-10-CM | POA: Insufficient documentation

## 2023-10-09 DIAGNOSIS — M79662 Pain in left lower leg: Secondary | ICD-10-CM

## 2023-10-09 LAB — BASIC METABOLIC PANEL WITH GFR
Anion gap: 12 (ref 5–15)
BUN: 10 mg/dL (ref 6–20)
CO2: 23 mmol/L (ref 22–32)
Calcium: 8.4 mg/dL — ABNORMAL LOW (ref 8.9–10.3)
Chloride: 101 mmol/L (ref 98–111)
Creatinine, Ser: 0.77 mg/dL (ref 0.61–1.24)
GFR, Estimated: >60 mL/min
Glucose, Bld: 103 mg/dL — ABNORMAL HIGH (ref 70–99)
Potassium: 4.1 mmol/L (ref 3.5–5.1)
Sodium: 136 mmol/L (ref 135–145)

## 2023-10-09 LAB — D-DIMER, QUANTITATIVE: D-Dimer, Quant: 1.5 ug{FEU}/mL — ABNORMAL HIGH (ref 0.00–0.50)

## 2023-10-09 LAB — HEPATIC FUNCTION PANEL
ALT: 25 U/L (ref 0–44)
AST: 25 U/L (ref 15–41)
Albumin: 4.2 g/dL (ref 3.5–5.0)
Alkaline Phosphatase: 105 U/L (ref 38–126)
Bilirubin, Direct: 0.1 mg/dL (ref 0.0–0.2)
Indirect Bilirubin: 0.2 mg/dL — ABNORMAL LOW (ref 0.3–0.9)
Total Bilirubin: 0.3 mg/dL (ref 0.0–1.2)
Total Protein: 7.9 g/dL (ref 6.5–8.1)

## 2023-10-09 LAB — CBC
HCT: 42.5 % (ref 39.0–52.0)
Hemoglobin: 14.3 g/dL (ref 13.0–17.0)
MCH: 29.5 pg (ref 26.0–34.0)
MCHC: 33.6 g/dL (ref 30.0–36.0)
MCV: 87.6 fL (ref 80.0–100.0)
Platelets: 400 10*3/uL (ref 150–400)
RBC: 4.85 MIL/uL (ref 4.22–5.81)
RDW: 15.6 % — ABNORMAL HIGH (ref 11.5–15.5)
WBC: 10.7 10*3/uL — ABNORMAL HIGH (ref 4.0–10.5)
nRBC: 0 % (ref 0.0–0.2)

## 2023-10-09 LAB — RESP PANEL BY RT-PCR (RSV, FLU A&B, COVID)  RVPGX2
Influenza A by PCR: NEGATIVE
Influenza B by PCR: NEGATIVE
Resp Syncytial Virus by PCR: NEGATIVE
SARS Coronavirus 2 by RT PCR: NEGATIVE

## 2023-10-09 LAB — LIPASE, BLOOD: Lipase: 20 U/L (ref 11–51)

## 2023-10-09 LAB — TROPONIN T, HIGH SENSITIVITY
Troponin T High Sensitivity: <15 ng/L (ref 0–19)
Troponin T High Sensitivity: <15 ng/L (ref 0–19)

## 2023-10-09 MED ORDER — IOHEXOL 300 MG/ML  SOLN: 100.0000 mL | Freq: Once | INTRAMUSCULAR | Status: DC | PRN

## 2023-10-09 MED ORDER — DOXYCYCLINE HYCLATE 100 MG PO TABS
100.0000 mg | ORAL_TABLET | Freq: Once | ORAL | Status: AC
  Administered 2023-10-09: 100 mg via ORAL
  Filled 2023-10-09: qty 1

## 2023-10-09 MED ORDER — ONDANSETRON 4 MG PO TBDP: 4.0000 mg | ORAL_TABLET | Freq: Three times a day (TID) | ORAL | 0 refills | Status: DC | PRN

## 2023-10-09 MED ORDER — DOXYCYCLINE HYCLATE 100 MG PO CAPS
100.0000 mg | ORAL_CAPSULE | Freq: Two times a day (BID) | ORAL | 0 refills | Status: DC
  Filled 2023-10-09: qty 10, 5d supply, fill #0

## 2023-10-09 MED ORDER — ASPIRIN 81 MG PO CHEW
324.0000 mg | CHEWABLE_TABLET | Freq: Once | ORAL | Status: AC
  Administered 2023-10-09: 324 mg via ORAL
  Filled 2023-10-09: qty 4

## 2023-10-09 MED ORDER — DOXYCYCLINE HYCLATE 100 MG PO CAPS: 100.0000 mg | ORAL_CAPSULE | Freq: Two times a day (BID) | ORAL | 0 refills | Status: AC

## 2023-10-09 MED ORDER — IOHEXOL 350 MG/ML SOLN
75.0000 mL | Freq: Once | INTRAVENOUS | Status: AC | PRN
  Administered 2023-10-09: 75 mL via INTRAVENOUS

## 2023-10-09 NOTE — Discharge Instructions (Signed)
 It was a pleasure taking care of you today.  As discussed, your workup was reassuring.  Your CT scan showed possible pneumonia.  I am sending you home with antibiotics.  Take as prescribed and finish all antibiotics.  I have included the number of [facility].  Please call to schedule an appointment to establish care.  Return to the ER for any worsening symptoms.

## 2023-10-09 NOTE — ED Provider Notes (Signed)
 [place] EMERGENCY DEPARTMENT AT [facility] Provider Note   CSN: [id number] Arrival date & time: 10/09/23  1229     Patient presents with: Chest Pain   Adam [name] is a 58 y.o. 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  Patient describes chest pain as a pressure-like sensation in the center of his chest that is nonradiating.  Patient states pressure has worsened over the past 2 days. Chest pain intermittent. Unclear whether or not worse with exertion. Symptoms started with nausea and vomiting.  Admits to numerous episodes of nonbloody, nonbilious emesis 2 days ago however, none since.  Also  endorses some shortness of breath.  No history of blood clots, recent surgeries, recent long immobilizations, or hormonal treatments.  Previously followed Dr. Ladona with cardiology.  Also endorses some dizziness and generalized weakness.  Patient states his Apple Watch has noted his heart rate to be increased.   History obtained from patient and past medical records. No interpreter used during encounter.       Prior to Admission medications   Medication Sig Start Date End Date Taking? Authorizing Provider  doxycycline  (VIBRAMYCIN ) 100 MG capsule Take 1 capsule (100 mg total) by mouth 2 (two) times daily for 5 days. 10/09/23 10/14/23 Yes Jalaiyah Throgmorton, Aleck BROCKS, PA-C  ALPRAZolam  (XANAX ) 0.25 MG tablet Take 1 tablet (0.25 mg total) by mouth 2 (two) times daily as needed for anxiety. 11/10/18   Stroud, Natalie M, FNP  aspirin  81 MG chewable tablet Chew 1 tablet (81 mg total) by mouth daily. 10/20/20   Patwardhan, Newman JINNY, MD  calcium -vitamin D  (OSCAL WITH D) 500-200 MG-UNIT tablet Take 1 tablet by mouth.    [provider]  ibuprofen  (ADVIL ) 800 MG tablet Take 1 tablet (800 mg total) by mouth 3 (three) times daily. 11/02/21   Billy Asberry FALCON, PA-C   lisinopril  (ZESTRIL ) 5 MG tablet Take 1 tablet (5 mg total) by mouth daily. 12/17/21   Patwardhan, Newman JINNY, MD  metoprolol  succinate (TOPROL -XL) 25 MG 24 hr tablet Take 1 tablet (25 mg total) by mouth daily. 12/17/21   Patwardhan, Newman JINNY, MD  Multiple Vitamins-Minerals (MULTIVITAMIN WITH MINERALS) tablet Take 1 tablet by mouth daily.    [provider]  neomycin -polymyxin-hydrocortisone (CORTISPORIN) 3.5-10000-1 OTIC suspension Place 4 drops into the right ear 3 (three) times daily. 03/28/22   Rolinda Rogue, MD  nitroGLYCERIN  (NITROSTAT ) 0.4 MG SL tablet Place 1 tablet (0.4 mg total) under the tongue every 5 (five) minutes as needed for chest pain. 11/11/18   Patwardhan, Newman JINNY, MD  pantoprazole  (PROTONIX ) 20 MG tablet Take 1 tablet (20 mg total) by mouth daily. Patient not taking: Reported on 03/28/2022 11/29/21   Blaise Aleene KIDD, MD  rosuvastatin  (CRESTOR ) 10 MG tablet Take 1 tablet (10 mg total) by mouth daily. 12/17/21   Patwardhan, Manish J, MD  sildenafil  (VIAGRA ) 100 MG tablet TAKE 0.5-1 TABLETS (50-100 MG TOTAL) BY MOUTH DAILY AS NEEDED FOR ERECTILE DYSFUNCTION. 10/31/22 10/31/23  Oley Bascom RAMAN, NP    Allergies: Patient has no known allergies.    Review of Systems  Constitutional:  Negative for chills and fever.  Respiratory:  Positive for shortness of breath. Negative for cough.   Cardiovascular:  Positive for chest pain.  Gastrointestinal:  Positive for nausea and vomiting. Negative for abdominal pain and diarrhea.    Updated Vital  Signs BP 112/74   Pulse 63   Temp 98 F (36.7 C) (Oral)   Resp 16   SpO2 98%   Physical Exam Vitals and nursing note reviewed.  Constitutional:      General: He is not in acute distress.    Appearance: He is not ill-appearing.  HENT:     Head: Normocephalic.  Eyes:     Pupils: Pupils are equal, round, and reactive to light.  Cardiovascular:     Rate and Rhythm: Normal rate and regular rhythm.     Pulses: Normal pulses.      Heart sounds: Normal heart sounds. No murmur heard.    No friction rub. No gallop.  Pulmonary:     Effort: Pulmonary effort is normal.     Breath sounds: Normal breath sounds.  Abdominal:     General: Abdomen is flat. There is no distension.     Palpations: Abdomen is soft.     Tenderness: There is no abdominal tenderness. There is no guarding or rebound.  Musculoskeletal:        General: Normal range of motion.     Cervical back: Neck supple.     Comments: No lower extremity edema. No calf tenderness  Skin:    General: Skin is warm and dry.  Neurological:     General: No focal deficit present.     Mental Status: He is alert.  Psychiatric:        Mood and Affect: Mood normal.        Behavior: Behavior normal.     (all labs ordered are listed, but only abnormal results are displayed) Labs Reviewed  BASIC METABOLIC PANEL WITH GFR - Abnormal; Notable for the following components:      Result Value   Glucose, Bld 103 (*)    Calcium  8.4 (*)    All other components within normal limits  CBC - Abnormal; Notable for the following components:   WBC 10.7 (*)    RDW 15.6 (*)    All other components within normal limits  D-DIMER, QUANTITATIVE - Abnormal; Notable for the following components:   D-Dimer, Quant 1.50 (*)    All other components within normal limits  HEPATIC FUNCTION PANEL - Abnormal; Notable for the following components:   Indirect Bilirubin 0.2 (*)    All other components within normal limits  RESP PANEL BY RT-PCR (RSV, FLU A&B, COVID)  RVPGX2  LIPASE, BLOOD  TROPONIN T, HIGH SENSITIVITY  TROPONIN T, HIGH SENSITIVITY    EKG: EKG Interpretation Date/Time:  Thursday October 09 2023 12:46:24 EDT Ventricular Rate:  61 PR Interval:  119 QRS Duration:  82 QT Interval:  373 QTC Calculation: 376 R Axis:   69  Text Interpretation: Sinus rhythm Borderline short PR interval Baseline wander in lead(s) V2 Confirmed by Patsey Lot [phone]) on 10/09/2023 1:03:54  PM  Radiology: [facility] Femur Min 2 Views Left Result Date: 10/09/2023 CLINICAL DATA:  Right leg pain EXAM: LEFT FEMUR 2 VIEWS COMPARISON:  None Available. FINDINGS: There is no evidence of fracture or other focal bone lesions. Scattered vascular calcifications. Excreted contrast in the partially imaged bladder. IMPRESSION: No acute osseous findings. Electronically Signed   By: Michaeline Blanch M.D.   On: 10/09/2023 16:50   CT Angio Chest PE W and/or Wo Contrast Result Date: 10/09/2023 CLINICAL DATA:  Shortness of breath. EXAM: CT ANGIOGRAPHY CHEST WITH CONTRAST TECHNIQUE: Multidetector CT imaging of the chest was performed using the standard protocol during bolus administration of intravenous  contrast. Multiplanar CT image reconstructions and MIPs were obtained to evaluate the vascular anatomy. RADIATION DOSE REDUCTION: This exam was performed according to the departmental dose-optimization program which includes automated exposure control, adjustment of the mA and/or kV according to patient size and/or use of iterative reconstruction technique. CONTRAST:  75mL OMNIPAQUE  IOHEXOL  350 MG/ML SOLN COMPARISON:  None Available. FINDINGS: Cardiovascular: Satisfactory opacification of the pulmonary arteries to the segmental level. No evidence of pulmonary embolism. Normal heart size. No pericardial effusion. Mediastinum/Nodes: No enlarged mediastinal, hilar, or axillary lymph nodes. Thyroid gland, trachea, and esophagus demonstrate no significant findings. Lungs/Pleura: Moderate to marked severity atelectasis and/or infiltrate is seen within the left lower lobe. Subsequent left sided volume loss is seen within this region. A small left pleural effusion and pleural thickening are also noted. No pneumothorax is identified. Upper Abdomen: No acute abnormality. Musculoskeletal: No chest wall abnormality. No acute or significant osseous findings. Review of the MIP images confirms the above findings. IMPRESSION: 1. Moderate to  marked severity left lower lobe atelectasis and/or infiltrate. Follow-up to resolution is recommended, as sequelae associated with an underlying neoplastic process cannot be excluded. 2. Small left pleural effusion and pleural thickening. 3. No evidence of pulmonary embolism. Electronically Signed   By: Suzen Dials M.D.   On: 10/09/2023 14:35   DG Chest 2 View Result Date: 10/09/2023 CLINICAL DATA:  Chest pain. EXAM: CHEST - 2 VIEW COMPARISON:  10/06/2017. FINDINGS: There is moderate left pleural effusion. No right pleural effusion. Bilateral lungs are otherwise clear. Normal cardio-mediastinal silhouette. No acute osseous abnormalities. The soft tissues are within normal limits. IMPRESSION: Moderate left pleural effusion. Electronically Signed   By: Ree Molt M.D.   On: 10/09/2023 13:04     Procedures   Medications Ordered in the ED  doxycycline  (VIBRA -TABS) tablet 100 mg (has no administration in time range)  aspirin  chewable tablet 324 mg (324 mg Oral Given 10/09/23 1308)  iohexol  (OMNIPAQUE ) 350 MG/ML injection 75 mL (75 mLs Intravenous Contrast Given 10/09/23 1411)    Clinical Course as of 10/09/23 1903  Thu Oct 09, 2023  1328 D-Dimer, Quant(!): 1.50 [CA]  1416 Troponin T High Sensitivity: <15 [CA]    Clinical Course User Index [CA] Lorelle Aleck BROCKS, PA-C                                 Medical Decision Making Amount and/or Complexity of Data Reviewed Independent Historian: caregiver    Details: Family at bedside provided history External Data Reviewed: notes.    Details: STEMI admission notes Labs: ordered. Decision-making details documented in ED Course. Radiology: ordered and independent interpretation performed. Decision-making details documented in ED Course. ECG/medicine tests: ordered and independent interpretation performed. Decision-making details documented in ED Course.  Risk OTC drugs. Prescription drug management.   This patient presents to the ED  for concern of CP, N, V, this involves an extensive number of treatment options, and is a complaint that carries with it a high risk of complications and morbidity.  The differential diagnosis includes ACS, PE, aortic dissection, PNA, viral process, etc  58 year old male presents to the ED due to chest pressure x 2 days. Admits to numerous episodes of nonbloody, nonbilious emesis.  He notes emesis has stopped however, chest pressure has worsened.  Admits to generalized weakness and dizziness.  Unsure whether or not this feels like his previous STEMI.  Also endorses some shortness of breath.  No  history of blood clots.  Denies lower extremity edema.  Upon arrival patient afebrile, not tachycardic or hypoxic.  Patient appears uncomfortable in bed.  Patient given ASA 324 mg.  No lower extremity edema.  No calf tenderness. No tenderness throughout abdomen. Routine labs ordered.  Troponin.  D-dimer to rule out PE given shortness of breath.  CBC with slight leukocytosis at 10.7.  Normal hemoglobin.  BMP with normal renal function.  No major electrolyte derangements.  D-Dimer elevated.  CTA chest ordered.  Troponin x 2 normal.  EKG normal sinus rhythm.  No signs of acute ischemia.  Low suspicion for ACS.  RVP negative.  CTA chest demonstrates left lower lobe atelectasis versus infiltrate.  Will treat for possible pneumonia.  Patient made aware for his need to follow-up after treatment.  No PE.  Does demonstrate a small left pleural effusion.  3:31 PM reassessed patient at bedside.  Patient resting comfortably in bed.  Reviewed results with patient and family at bedside. CTA with possible infiltrate.  Will treat for pneumonia with antibiotics.  Patient does admit to some left lower extremity pain.  X-ray ordered to rule out bony fracture.  No known injury. He also has concerns about a possible DVT. US  ordered.   US  negative for DVT.  X-ray negative for bony fracture or acute abnormality. Upon reassessment, patient  notes resolution in chest pain.  Chest pain could be possibly related to possible pneumonia on CTA chest.  No evidence of blood clots.  Low suspicion for ACS given negative troponins.  Presentation nonconcerning for aortic dissection.  Patient given first dose of doxycycline  here in the ED for possible pneumonia and discharged with prescription.  Advised patient to follow-up with PCP for a recheck patient stable for discharge. Strict ED precautions discussed with patient. Patient states understanding and agrees to plan. Patient discharged home in no acute distress and stable vitals  Co morbidities that complicate the patient evaluation  CAD Cardiac Monitoring: / EKG:  The patient was maintained on a cardiac monitor.  I personally viewed and interpreted the cardiac monitored which showed an underlying rhythm of: NSR  Social Determinants of Health:  No PCP  Test / Admission - Considered:  Considered admission ; however no evidence of respiratory distress, feel patient can be treated for PNA in outpatient setting.      Final diagnoses:  Nonspecific chest pain  Pneumonia of left lower lobe due to infectious organism    ED Discharge Orders          Ordered    doxycycline  (VIBRAMYCIN ) 100 MG capsule  2 times daily        10/09/23 1901               Pinchos Topel C, PA-C 10/09/23 RUDEAN Patsey Lot, MD 10/11/23 1130

## 2023-10-09 NOTE — ED Triage Notes (Signed)
 Pt arrives with c/o shortness of breath, chest pain progressing since yesterday. Pt states his HR has been elevated, and he has dizziness and generalized weakness.

## 2023-10-10 ENCOUNTER — Other Ambulatory Visit: Payer: Self-pay

## 2023-10-23 ENCOUNTER — Telehealth: Payer: Self-pay

## 2023-10-23 NOTE — Telephone Encounter (Unsigned)
 Copied from CRM [id number]. Topic: Appointments - Scheduling Inquiry for Clinic >> Oct 23, 2023  1:35 PM Donee H wrote: Reason for CRM: Patient's girlfriend Apolinar called to state patient needs an ER follow up. Patient was seen and discharged from [facility] on Sept. 11, 2025. He currently doesn't have a pcp and was referred by Darryle Law. There are no providers showing accepting new patients. Please follow up with Apolinar on request [phone]

## 2023-10-24 NOTE — Telephone Encounter (Signed)
 Adam [name] was called and informed that we are currently not accepting patient at this time.

## 2023-12-08 ENCOUNTER — Other Ambulatory Visit: Payer: Self-pay

## 2023-12-08 ENCOUNTER — Ambulatory Visit: Payer: Self-pay

## 2023-12-08 ENCOUNTER — Ambulatory Visit (INDEPENDENT_AMBULATORY_CARE_PROVIDER_SITE_OTHER): Payer: Self-pay

## 2023-12-08 VITALS — BP 114/75 | HR 62 | Temp 98.2°F | Resp 16 | Ht 65.0 in | Wt 135.6 lb

## 2023-12-08 DIAGNOSIS — Z8701 Personal history of pneumonia (recurrent): Secondary | ICD-10-CM

## 2023-12-08 DIAGNOSIS — Z7689 Persons encountering health services in other specified circumstances: Secondary | ICD-10-CM

## 2023-12-08 DIAGNOSIS — I252 Old myocardial infarction: Secondary | ICD-10-CM

## 2023-12-08 DIAGNOSIS — I2510 Atherosclerotic heart disease of native coronary artery without angina pectoris: Secondary | ICD-10-CM

## 2023-12-08 MED ORDER — ROSUVASTATIN CALCIUM 10 MG PO TABS
10.0000 mg | ORAL_TABLET | Freq: Every day | ORAL | 3 refills | Status: DC
  Filled 2023-12-08: qty 90, 90d supply, fill #0

## 2023-12-08 MED ORDER — ASPIRIN 81 MG PO CHEW
81.0000 mg | CHEWABLE_TABLET | Freq: Every day | ORAL | 3 refills | Status: AC
Start: 2023-12-08 — End: ?
  Filled 2023-12-08: qty 90, 90d supply, fill #0
  Filled 2024-02-16 (×2): qty 90, 90d supply, fill #1

## 2023-12-08 MED ORDER — LISINOPRIL 5 MG PO TABS
5.0000 mg | ORAL_TABLET | Freq: Every day | ORAL | 3 refills | Status: AC
  Filled 2023-12-08: qty 90, 90d supply, fill #0
  Filled 2024-02-16 (×2): qty 90, 90d supply, fill #1

## 2023-12-08 MED ORDER — METOPROLOL SUCCINATE ER 25 MG PO TB24
25.0000 mg | ORAL_TABLET | Freq: Every day | ORAL | 3 refills | Status: AC
  Filled 2023-12-08: qty 90, 90d supply, fill #0
  Filled 2024-02-16: qty 90, 90d supply, fill #1
  Filled 2024-02-16: qty 30, 30d supply, fill #1

## 2023-12-08 NOTE — Progress Notes (Signed)
 Patient ID: Adam [name], male    DOB: 02-23-1965  MRN: [id number]  CC: Establish Care   Subjective: Adam [name] is a 58 y.o. male with past medical history of CAD with stent placement who presents to clinic to establish care and to follow-up on recent pneumonia diagnosis. Patient reports that today he is feeling well overall and symptoms have resolved.  Patient was diagnosed with pneumonia on 09/11 but has not had post Hospital discharge visit.  Patient also reports that he has been out of medication for the past 3 months due to no longer having PCP.  Exercise: walking, yard work Diet: healthy meals  No Known Allergies  ROS: Review of Systems Negative except as stated above  PHYSICAL EXAM: BP 114/75   Pulse 62   Temp 98.2 F (36.8 C) (Oral)   Resp 16   Ht 5' 5 (1.651 m)   Wt 135 lb 9.6 oz (61.5 kg)   SpO2 98%   BMI 22.57 kg/m   Physical Exam  General: well-appearing, no acute distress Skin: no jaundice, rashes, or lesions Cardiovascular: regular heart rate and rhythm, normal S1/S2, no murmurs, gallops, or rubs, peripheral pulses 2+ bilaterally Chest: no skeletal deformity, lungs clear to auscultation bilaterally, equal breath sounds bilaterally Musculoskeletal: normal gait Extremities: no peripheral edema  ASSESSMENT AND PLAN:  1. Encounter to establish care with new provider (Primary) -Plan to follow-up in 1 month for yearly physical and labs.  2. Coronary artery disease involving native coronary artery of native heart without angina pectoris - Restarting prior medication regimen - aspirin  81 MG chewable tablet; Chew 1 tablet (81 mg total) by mouth daily.  Dispense: 90 tablet; Refill: 3 - lisinopril  (ZESTRIL ) 5 MG tablet; Take 1 tablet (5 mg total) by mouth daily.  Dispense: 90 tablet; Refill: 3 - metoprolol  succinate (TOPROL -XL) 25 MG 24 hr tablet; Take 1 tablet (25 mg total) by mouth daily.  Dispense: 90 tablet; Refill: 3 - rosuvastatin  (CRESTOR ) 10  MG tablet; Take 1 tablet (10 mg total) by mouth daily.  Dispense: 90 tablet; Refill: 3  3. History of myocardial infarction - aspirin  81 MG chewable tablet; Chew 1 tablet (81 mg total) by mouth daily.  Dispense: 90 tablet; Refill: 3 - lisinopril  (ZESTRIL ) 5 MG tablet; Take 1 tablet (5 mg total) by mouth daily.  Dispense: 90 tablet; Refill: 3 - metoprolol  succinate (TOPROL -XL) 25 MG 24 hr tablet; Take 1 tablet (25 mg total) by mouth daily.  Dispense: 90 tablet; Refill: 3 - rosuvastatin  (CRESTOR ) 10 MG tablet; Take 1 tablet (10 mg total) by mouth daily.  Dispense: 90 tablet; Refill: 3  4. History of pneumonia -Repeat chest x-ray to assess for resolution of pneumonia.  - DG Chest 2 View; Future   Patient was given the opportunity to ask questions.  Patient verbalized understanding of the plan and was able to repeat key elements of the plan.    Orders Placed This Encounter  Procedures   DG Chest 2 View     Requested Prescriptions   Signed Prescriptions Disp Refills   aspirin  81 MG chewable tablet 90 tablet 3    Sig: Chew 1 tablet (81 mg total) by mouth daily.   lisinopril  (ZESTRIL ) 5 MG tablet 90 tablet 3    Sig: Take 1 tablet (5 mg total) by mouth daily.   metoprolol  succinate (TOPROL -XL) 25 MG 24 hr tablet 90 tablet 3    Sig: Take 1 tablet (25 mg total) by mouth daily.  rosuvastatin  (CRESTOR ) 10 MG tablet 90 tablet 3    Sig: Take 1 tablet (10 mg total) by mouth daily.    Return in about 1 month (around 01/07/2024) for physical, labs.  Sula Leavy Rode, PA-C

## 2023-12-10 ENCOUNTER — Ambulatory Visit: Payer: Self-pay

## 2023-12-16 ENCOUNTER — Other Ambulatory Visit: Payer: Self-pay

## 2024-01-08 ENCOUNTER — Ambulatory Visit: Payer: Self-pay

## 2024-01-08 VITALS — BP 109/72 | HR 62 | Temp 98.0°F | Resp 17 | Ht 66.0 in | Wt 137.4 lb

## 2024-01-08 DIAGNOSIS — Z0000 Encounter for general adult medical examination without abnormal findings: Secondary | ICD-10-CM

## 2024-01-08 DIAGNOSIS — Z13220 Encounter for screening for lipoid disorders: Secondary | ICD-10-CM

## 2024-01-08 DIAGNOSIS — Z136 Encounter for screening for cardiovascular disorders: Secondary | ICD-10-CM

## 2024-01-08 DIAGNOSIS — R1900 Intra-abdominal and pelvic swelling, mass and lump, unspecified site: Secondary | ICD-10-CM

## 2024-01-08 NOTE — Progress Notes (Unsigned)
 Patient ID: Adam [name], male    DOB: [birth year]  MRN: [id number]  CC: Follow-up (Pain in stomach)   Subjective: Amrit Cress is a 58 y.o. male with past medical history of STEMI who presents to clinic for annual physical exam. Pt reports that he is not taking any of the medications prescribed to him. He is only taking aspirin .  Patient reports that he feels like he does not need the medication since he lost weight and his blood pressure has been under control.    Patient's concern today is pain in the abdomen.  Patient reports that a few days ago he coughed and felt a bulge in his lower abdomen that eventually went away.  Patient reports that this is only happening once.  Denies history of abdominal surgery, extraneous activities, or episodes like this in the past.  ROS: Review of Systems Negative except as stated above  PHYSICAL EXAM: BP 109/72 (BP Location: Left Arm)   Pulse 62   Temp 98 F (36.7 C)   Resp 17   Ht 5' 6 (1.676 m)   Wt 137 lb 6.4 oz (62.3 kg)   SpO2 93%   BMI 22.18 kg/m   Physical Exam  General: well-appearing, no acute distress Skin: no jaundice, rashes, or lesions Head: normocephalic, no lesions, no abnormal hair distribution or loss Eyes: anicteric sclera, pupils equally round and reactive to light and accommodation, extraocular movements intact Ears: no external lesions, tympanic membrane translucent  Nose: no septal deviation, turbinates clear Throat: trachea midline, no thyromegaly, moist mucus membranes Cardiovascular: regular heart rate and rhythm, normal S1/S2, no murmurs, gallops, or rubs, peripheral pulses 2+ bilaterally Chest: no skeletal deformity, lungs clear to auscultation bilaterally, equal breath sounds bilaterally Abdomen: soft, non-distended, non-tender to palpation, no hepatomegaly, no splenomegaly, normoactive bowel sounds. No palpable abdominal hernia. Musculoskeletal: strength of upper and lower extremities equal  bilaterally, 5/5, normal gait Extremities: no peripheral edema, nails intact Neurologic: cranial nerves II-XII intact      Latest Ref Rng & Units 10/09/2023    1:03 PM 10/09/2023   12:42 PM 11/29/2021   10:41 AM  CMP  Glucose 70 - 99 mg/dL  896  880   BUN 6 - 20 mg/dL  10  11   Creatinine 9.38 - 1.24 mg/dL  9.22  8.90   Sodium 864 - 145 mmol/L  136  137   Potassium 3.5 - 5.1 mmol/L  4.1  3.8   Chloride 98 - 111 mmol/L  101  104   CO2 22 - 32 mmol/L  23  26   Calcium  8.9 - 10.3 mg/dL  8.4  9.7   Total Protein 6.5 - 8.1 g/dL 7.9   7.1   Total Bilirubin 0.0 - 1.2 mg/dL 0.3   0.5   Alkaline Phos 38 - 126 U/L 105   79   AST 15 - 41 U/L 25   33   ALT 0 - 44 U/L 25   29    Lipid Panel     Component Value Date/Time   CHOL 245 (H) 01/08/2024 1515   TRIG 146 01/08/2024 1515   HDL 68 01/08/2024 1515   CHOLHDL 3.6 01/08/2024 1515   CHOLHDL 4.6 10/06/2017 0215   VLDL 46 (H) 10/06/2017 0215   LDLCALC 151 (H) 01/08/2024 1515    CBC    Component Value Date/Time   WBC 10.7 (H) 10/09/2023 1242   RBC 4.85 10/09/2023 1242   HGB 14.3 10/09/2023 1242  HGB 15.8 11/10/2018 1152   HCT 42.5 10/09/2023 1242   HCT 47.1 11/10/2018 1152   PLT 400 10/09/2023 1242   PLT 362 11/10/2018 1152   MCV 87.6 10/09/2023 1242   MCV 89 11/10/2018 1152   MCH 29.5 10/09/2023 1242   MCHC 33.6 10/09/2023 1242   RDW 15.6 (H) 10/09/2023 1242   RDW 13.7 11/10/2018 1152   LYMPHSABS 2.8 11/29/2021 1241   LYMPHSABS 2.6 11/10/2018 1152   MONOABS 1.0 11/29/2021 1241   EOSABS 0.6 (H) 11/29/2021 1241   EOSABS 0.2 11/10/2018 1152   BASOSABS 0.1 11/29/2021 1241   BASOSABS 0.1 11/10/2018 1152    ASSESSMENT AND PLAN:  1. Encounter for annual wellness visit (Primary) - Complete physical exam performed today, no abnormal findings. - Pt advised to take all medications as prescribed given history of STEMI. Educated on benefits of metoprolol  after MI. Pt agreed to restart metoprolol  for its cardioprotective benefits.     2. Abdominal wall bulge - Abdominal not palpable during exam in sitting or supine.  - Will continue to monitor. - Pt educated on potential risk of strangulation if he has recurring hernia. - Pt advised to seek emergent care in the future if bulge reoccurs and it can not be reduced.   3. Encounter for lipid screening for cardiovascular disease - Lipid panel   Patient was given the opportunity to ask questions.  Patient verbalized understanding of the plan and was able to repeat key elements of the plan. Patient was given clear instructions to go to Emergency Department or return to medical center if symptoms don't improve, worsen, or new problems develop.The patient verbalized understanding.   Orders Placed This Encounter  Procedures   Lipid panel     Return in about 6 months (around 07/08/2024) for follow-up.  Sula Leavy Rode, PA-C

## 2024-01-09 ENCOUNTER — Other Ambulatory Visit: Payer: Self-pay

## 2024-01-09 ENCOUNTER — Ambulatory Visit: Payer: Self-pay

## 2024-01-09 DIAGNOSIS — E782 Mixed hyperlipidemia: Secondary | ICD-10-CM

## 2024-01-09 LAB — LIPID PANEL
Chol/HDL Ratio: 3.6 ratio (ref 0.0–5.0)
Cholesterol, Total: 245 mg/dL — ABNORMAL HIGH (ref 100–199)
HDL: 68 mg/dL (ref >39)
LDL Chol Calc (NIH): 151 mg/dL — ABNORMAL HIGH (ref 0–99)
Triglycerides: 146 mg/dL (ref 0–149)
VLDL Cholesterol Cal: 26 mg/dL (ref 5–40)

## 2024-01-09 MED ORDER — PRAVASTATIN SODIUM 10 MG PO TABS
10.0000 mg | ORAL_TABLET | Freq: Every evening | ORAL | 3 refills | Status: AC
  Filled 2024-01-09: qty 30, 30d supply, fill #0
  Filled 2024-02-16 (×2): qty 30, 30d supply, fill #1

## 2024-01-19 ENCOUNTER — Other Ambulatory Visit: Payer: Self-pay

## 2024-01-20 ENCOUNTER — Other Ambulatory Visit: Payer: Self-pay

## 2024-02-16 ENCOUNTER — Other Ambulatory Visit: Payer: Self-pay

## 2024-02-16 ENCOUNTER — Other Ambulatory Visit: Payer: Self-pay | Admitting: Nurse Practitioner

## 2024-02-16 DIAGNOSIS — N529 Male erectile dysfunction, unspecified: Secondary | ICD-10-CM

## 2024-02-16 MED ORDER — SILDENAFIL CITRATE 100 MG PO TABS
50.0000 mg | ORAL_TABLET | Freq: Every day | ORAL | 11 refills | Status: AC | PRN
  Filled 2024-02-16: qty 10, 30d supply, fill #0

## 2024-02-16 NOTE — Telephone Encounter (Signed)
 sildenafil  (VIAGRA ) 100 MG tablet

## 2024-02-26 ENCOUNTER — Other Ambulatory Visit: Payer: Self-pay

## 2024-02-27 ENCOUNTER — Other Ambulatory Visit: Payer: Self-pay

## 2024-07-08 ENCOUNTER — Ambulatory Visit: Payer: Self-pay
